# Patient Record
Sex: Male | Born: 1990 | Race: Black or African American | Hispanic: No | Marital: Single | Smoking: Current every day smoker
Health system: Southern US, Community
[De-identification: ages and names within clinical notes are randomized; demographics above are authoritative.]

## PROBLEM LIST (undated history)

## (undated) DIAGNOSIS — F319 Bipolar disorder, unspecified: Secondary | ICD-10-CM

## (undated) DIAGNOSIS — F209 Schizophrenia, unspecified: Secondary | ICD-10-CM

---
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
## 2015-08-13 ENCOUNTER — Emergency Department
Admission: EM | Admit: 2015-08-13 | Discharge: 2015-08-14 | Disposition: A | Discharge status: Home / Self Care | Payer: Self-pay | Attending: Emergency Medicine | Admitting: Emergency Medicine

## 2015-08-13 DIAGNOSIS — F1210 Cannabis abuse, uncomplicated: Secondary | ICD-10-CM | POA: Insufficient documentation

## 2015-08-13 DIAGNOSIS — Z720 Tobacco use: Secondary | ICD-10-CM | POA: Insufficient documentation

## 2015-08-13 DIAGNOSIS — F1010 Alcohol abuse, uncomplicated: Secondary | ICD-10-CM | POA: Insufficient documentation

## 2015-08-13 LAB — ETHANOL: ALCOHOL ETHYL (B): 190 mg/dL — AB (ref <5)

## 2015-08-13 LAB — COMPREHENSIVE METABOLIC PANEL
ALT: 22 U/L (ref 17–63)
ANION GAP: 11 (ref 5–15)
AST: 41 U/L (ref 15–41)
Albumin: 5.1 g/dL — ABNORMAL HIGH (ref 3.5–5.0)
Alkaline Phosphatase: 53 U/L (ref 38–126)
BUN: 7 mg/dL (ref 6–20)
CHLORIDE: 108 mmol/L (ref 101–111)
CO2: 24 mmol/L (ref 22–32)
Calcium: 9.5 mg/dL (ref 8.9–10.3)
Creatinine, Ser: 1.15 mg/dL (ref 0.61–1.24)
Glucose, Bld: 87 mg/dL (ref 65–99)
POTASSIUM: 4.3 mmol/L (ref 3.5–5.1)
Sodium: 143 mmol/L (ref 135–145)
Total Bilirubin: 1 mg/dL (ref 0.3–1.2)
Total Protein: 7.8 g/dL (ref 6.5–8.1)

## 2015-08-13 LAB — SALICYLATE LEVEL: Salicylate Lvl: <4 mg/dL (ref 2.8–30.0)

## 2015-08-13 LAB — CBC
HEMATOCRIT: 45.7 % (ref 40.0–52.0)
HEMOGLOBIN: 14.8 g/dL (ref 13.0–18.0)
MCH: 28.2 pg (ref 26.0–34.0)
MCHC: 32.5 g/dL (ref 32.0–36.0)
MCV: 86.8 fL (ref 80.0–100.0)
Platelets: 233 10*3/uL (ref 150–440)
RBC: 5.26 MIL/uL (ref 4.40–5.90)
RDW: 13.4 % (ref 11.5–14.5)
WBC: 8.1 10*3/uL (ref 3.8–10.6)

## 2015-08-13 LAB — URINE DRUG SCREEN, QUALITATIVE (ARMC ONLY)
AMPHETAMINES, UR SCREEN: NOT DETECTED
BENZODIAZEPINE, UR SCRN: NOT DETECTED
Barbiturates, Ur Screen: NOT DETECTED
COCAINE METABOLITE, UR ~~LOC~~: NOT DETECTED
Cannabinoid 50 Ng, Ur ~~LOC~~: POSITIVE — AB
MDMA (Ecstasy)Ur Screen: NOT DETECTED
METHADONE SCREEN, URINE: NOT DETECTED
OPIATE, UR SCREEN: NOT DETECTED
Phencyclidine (PCP) Ur S: NOT DETECTED
Tricyclic, Ur Screen: NOT DETECTED

## 2015-08-13 LAB — ACETAMINOPHEN LEVEL
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
## 2015-08-13 NOTE — ED Notes (Signed)
Tele-intake set-up for pt ; [place], intake spec, to pt ATT

## 2015-08-13 NOTE — ED Notes (Signed)
Pt sitting on floor; despondent and tearful; moved back to bed; security and tech made aware

## 2015-08-14 DIAGNOSIS — F1020 Alcohol dependence, uncomplicated: Secondary | ICD-10-CM

## 2015-08-14 NOTE — ED Notes (Signed)

## 2015-08-14 NOTE — ED Notes (Signed)
BEHAVIORAL HEALTH ROUNDING Patient sleeping: No. Patient alert and oriented: yes Behavior appropriate: Yes.  ; If no, describe:   Nutrition and fluids offered: Yes  Toileting and hygiene offered: Yes  Sitter present: no Law enforcement present: Yes  and ODS  

## 2015-08-14 NOTE — ED Notes (Signed)
BEHAVIORAL HEALTH ROUNDING Patient sleeping: Yes.   Patient alert and oriented: not applicable Behavior appropriate: Yes.  ; If no, describe:   Nutrition and fluids offered: No Toileting and hygiene offered: No Sitter present: no Law enforcement present: Yes  and ODS    

## 2015-08-14 NOTE — ED Notes (Addendum)
BEHAVIORAL HEALTH ROUNDING Patient sleeping: Yes.   Patient alert and oriented: not applicable Behavior appropriate: Yes.  ; If no, describe:   Nutrition and fluids offered: No Toileting and hygiene offered: No Sitter present: no Law enforcement present: Yes  and ODS    

## 2015-08-14 NOTE — ED Notes (Signed)
BEHAVIORAL HEALTH ROUNDING Patient sleeping: No. Patient alert and oriented: yes Behavior appropriate: Yes.  ; If no, describe: animated and cooperative Nutrition and fluids offered: Yes  Toileting and hygiene offered: Yes  Sitter present: no Law enforcement present: Yes  and ODS  ENVIRONMENTAL ASSESSMENT Potentially harmful objects out of patient reach: Yes.   Personal belongings secured: Yes.   Patient dressed in hospital provided attire only: Yes.   Plastic bags out of patient reach: Yes.   Patient care equipment (cords, cables, call bells, lines, and drains) shortened, removed, or accounted for: Yes.   Equipment and supplies removed from bottom of stretcher: Yes.   Potentially toxic materials out of patient reach: Yes.   Sharps container removed or out of patient reach: Yes.

## 2015-08-14 NOTE — ED Provider Notes (Signed)
-----------------------------------------   6:31 AM on 08/14/2015 -----------------------------------------   Blood pressure 107/80, pulse 71, temperature 97.9 F (36.6 C), temperature source Oral, resp. rate 18, height  (1.88 m), weight 131 lb (59.421 kg), SpO2 96 %.  The patient had no acute events since last update.  Calm and cooperative at this time.  Disposition is pending per Psychiatry/Behavioral Medicine team recommendations.     Arnaldo Natal, MD 08/14/15 [phone]

## 2015-08-14 NOTE — ED Notes (Signed)

## 2015-08-14 NOTE — ED Provider Notes (Signed)
-----------------------------------------   11:04 AM on 08/14/2015 -----------------------------------------   Blood pressure 118/76, pulse 69, temperature 98 F (36.7 C), temperature source Oral, resp. rate 18, height  (1.88 m), weight 131 lb (59.421 kg), SpO2 100 %.  The patient had no acute events since last update.  Calm and cooperative at this time.    Patient has remained medically stable in the ED. Case is discussed with social work and Dr. Micki Riley psychiatry note was reviewed. He is found to be psychiatrically stable at this time as well. He will go home to his grandmother's house and has been given abundant outpatient referral resources for further evaluation and management of his alcohol abuse and chronic mental health problems.  Final diagnoses:  Alcohol abuse     Sharman Cheek, MD 08/14/15 1105
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
## 2015-08-14 NOTE — ED Notes (Signed)

## 2015-08-14 NOTE — ED Notes (Signed)
Patient discharged ambulatory to self. He denies SI or HI. Discharge instructions reviewed with patient, he verbalizes understanding. Patient received all personal belongings. 

## 2015-08-14 NOTE — ED Notes (Signed)
Seen by social worker
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
## 2015-08-14 NOTE — Discharge Instructions (Signed)
Alcohol Use Disorder Alcohol use disorder is a mental disorder. It is not a one-time incident of heavy drinking. Alcohol use disorder is the excessive and uncontrollable use of alcohol over time that leads to problems with functioning in one or more areas of daily living. People with this disorder risk harming themselves and others when they drink to excess. Alcohol use disorder also can cause other mental disorders, such as mood and anxiety disorders, and serious physical problems. People with alcohol use disorder often misuse other drugs.  Alcohol use disorder is common and widespread. Some people with this disorder drink alcohol to cope with or escape from negative life events. Others drink to relieve chronic pain or symptoms of mental illness. People with a family history of alcohol use disorder are at higher risk of losing control and using alcohol to excess.  SYMPTOMS  Signs and symptoms of alcohol use disorder may include the following:   Consumption ofalcohol inlarger amounts or over a longer period of time than intended.  Multiple unsuccessful attempts to cutdown or control alcohol use.   A great deal of time spent obtaining alcohol, using alcohol, or recovering from the effects of alcohol (hangover).  A strong desire or urge to use alcohol (cravings).   Continued use of alcohol despite problems at work, school, or home because of alcohol use.   Continued use of alcohol despite problems in relationships because of alcohol use.  Continued use of alcohol in situations when it is physically hazardous, such as driving a car.  Continued use of alcohol despite awareness of a physical or psychological problem that is likely related to alcohol use. Physical problems related to alcohol use can involve the brain, heart, liver, stomach, and intestines. Psychological problems related to alcohol use include intoxication, depression, anxiety, psychosis, delirium, and dementia.   The need for  increased amounts of alcohol to achieve the same desired effect, or a decreased effect from the consumption of the same amount of alcohol (tolerance).  Withdrawal symptoms upon reducing or stopping alcohol use, or alcohol use to reduce or avoid withdrawal symptoms. Withdrawal symptoms include:  Racing heart.  Hand tremor.  Difficulty sleeping.  Nausea.  Vomiting.  Hallucinations.  Restlessness.  Seizures. DIAGNOSIS Alcohol use disorder is diagnosed through an assessment by your health care provider. Your health care provider may start by asking three or four questions to screen for excessive or problematic alcohol use. To confirm a diagnosis of alcohol use disorder, at least two symptoms must be present within a 12-month period. The severity of alcohol use disorder depends on the number of symptoms:  Mild--two or three.  Moderate--four or five.  Severe--six or more. Your health care provider may perform a physical exam or use results from lab tests to see if you have physical problems resulting from alcohol use. Your health care provider may refer you to a mental health professional for evaluation. TREATMENT  Some people with alcohol use disorder are able to reduce their alcohol use to low-risk levels. Some people with alcohol use disorder need to quit drinking alcohol. When necessary, mental health professionals with specialized training in substance use treatment can help. Your health care provider can help you decide how severe your alcohol use disorder is and what type of treatment you need. The following forms of treatment are available:   Detoxification. Detoxification involves the use of prescription medicines to prevent alcohol withdrawal symptoms in the first week after quitting. This is important for people with a history of symptoms   of withdrawal and for heavy drinkers who are likely to have withdrawal symptoms. Alcohol withdrawal can be dangerous and, in severe cases, cause  death. Detoxification is usually provided in a hospital or in-patient substance use treatment facility.  Counseling or talk therapy. Talk therapy is provided by substance use treatment counselors. It addresses the reasons people use alcohol and ways to keep them from drinking again. The goals of talk therapy are to help people with alcohol use disorder find healthy activities and ways to cope with life stress, to identify and avoid triggers for alcohol use, and to handle cravings, which can cause relapse.  Medicines.Different medicines can help treat alcohol use disorder through the following actions:  Decrease alcohol cravings.  Decrease the positive reward response felt from alcohol use.  Produce an uncomfortable physical reaction when alcohol is used (aversion therapy).  Support groups. Support groups are run by people who have quit drinking. They provide emotional support, advice, and guidance. These forms of treatment are often combined. Some people with alcohol use disorder benefit from intensive combination treatment provided by specialized substance use treatment centers. Both inpatient and outpatient treatment programs are available. Document Released: 01/09/2005 Document Revised: 04/18/2014 Document Reviewed: 03/11/2013 Exit[employer] Patient Information 2015 [employer]. This information is not intended to replace advice given to you by your health care provider. Make sure you discuss any questions you have with your health care provider.  

## 2015-11-11 ENCOUNTER — Emergency Department
Admission: EM | Admit: 2015-11-11 | Discharge: 2015-11-12 | Disposition: A | Discharge status: Other Acute Inpatient Hospital | Payer: Self-pay | Attending: Emergency Medicine | Admitting: Emergency Medicine

## 2015-11-11 ENCOUNTER — Encounter: Payer: Self-pay | Admitting: Emergency Medicine

## 2015-11-11 DIAGNOSIS — F10229 Alcohol dependence with intoxication, unspecified: Secondary | ICD-10-CM | POA: Insufficient documentation

## 2015-11-11 DIAGNOSIS — F10929 Alcohol use, unspecified with intoxication, unspecified: Secondary | ICD-10-CM

## 2015-11-11 DIAGNOSIS — F29 Unspecified psychosis not due to a substance or known physiological condition: Secondary | ICD-10-CM | POA: Insufficient documentation

## 2015-11-11 DIAGNOSIS — F1210 Cannabis abuse, uncomplicated: Secondary | ICD-10-CM | POA: Insufficient documentation

## 2015-11-11 DIAGNOSIS — F17210 Nicotine dependence, cigarettes, uncomplicated: Secondary | ICD-10-CM | POA: Insufficient documentation

## 2015-11-11 LAB — CBC
HCT: 50.1 % (ref 40.0–52.0)
Hemoglobin: 16.2 g/dL (ref 13.0–18.0)
MCH: 28.3 pg (ref 26.0–34.0)
MCHC: 32.4 g/dL (ref 32.0–36.0)
MCV: 87.4 fL (ref 80.0–100.0)
PLATELETS: 315 10*3/uL (ref 150–440)
RBC: 5.73 MIL/uL (ref 4.40–5.90)
RDW: 14 % (ref 11.5–14.5)
WBC: 9.8 10*3/uL (ref 3.8–10.6)

## 2015-11-11 LAB — COMPREHENSIVE METABOLIC PANEL
ALBUMIN: 4.9 g/dL (ref 3.5–5.0)
ALT: 26 U/L (ref 17–63)
ANION GAP: 12 (ref 5–15)
AST: 48 U/L — AB (ref 15–41)
Alkaline Phosphatase: 95 U/L (ref 38–126)
BILIRUBIN TOTAL: 0.4 mg/dL (ref 0.3–1.2)
BUN: 14 mg/dL (ref 6–20)
CHLORIDE: 109 mmol/L (ref 101–111)
CO2: 23 mmol/L (ref 22–32)
Calcium: 9.1 mg/dL (ref 8.9–10.3)
Creatinine, Ser: 1.02 mg/dL (ref 0.61–1.24)
GFR calc Af Amer: >60 mL/min (ref >60)
Glucose, Bld: 86 mg/dL (ref 65–99)
POTASSIUM: 4 mmol/L (ref 3.5–5.1)
Sodium: 144 mmol/L (ref 135–145)
TOTAL PROTEIN: 8.1 g/dL (ref 6.5–8.1)

## 2015-11-11 LAB — ETHANOL: ALCOHOL ETHYL (B): 374 mg/dL — AB (ref <5)

## 2015-11-11 LAB — SALICYLATE LEVEL

## 2015-11-11 LAB — ACETAMINOPHEN LEVEL

## 2015-11-11 MED ORDER — ZIPRASIDONE HCL 20 MG PO CAPS
ORAL_CAPSULE | ORAL | Status: AC
  Administered 2015-11-11: 20 mg
  Filled 2015-11-11: qty 1
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
## 2015-11-11 NOTE — ED Provider Notes (Signed)
[facility] Emergency Department Provider Note  Time seen: 8:55 PM  I have reviewed the triage vital signs and the nursing notes.   HISTORY  Chief Complaint Suicidal    HPI Joshua [name] is a 24 y.o. male with a past medical history of suicide attempts in the past, depression, who presents the emergency department under an involuntary commitment. According to the involuntary commitment taken out by police the patient was in the front yard yelling that he knows the devil, and he will show everybody the devil. IVC also states the patient was making suicidal comments such as known cares if he is around anymore. In the emergency department the patient continues to show bizarre behavior, laughing inappropriately at times, walking around the room, police are present but not currently requiring restraints at this time.      History reviewed. No pertinent past medical history.  Patient Active Problem List   Diagnosis Date Noted  . Alcohol use disorder, moderate, dependence (HCC)     History reviewed. No pertinent past surgical history.  No current outpatient prescriptions on file.  Allergies Review of patient's allergies indicates no known allergies.  No family history on file.  Social History Social History  Substance Use Topics  . Smoking status: Current Every Day Smoker -- 1.00 packs/day for 5 years    Types: Cigarettes  . Smokeless tobacco: None  . Alcohol Use: Yes     Comment: pt states does not drink every day, but drank 2 40's today.    Review of Systems Constitutional: Negative for fever. Cardiovascular: Negative for chest pain. Respiratory: Negative for shortness of breath. Gastrointestinal: Negative for abdominal pain Musculoskeletal: Negative for back pain. Neurological: Negative for headache Psychiatric: Bizarre behavior but denies any SI or HI in the emergency department. 10-point ROS otherwise  negative.  ____________________________________________   PHYSICAL EXAM:  VITAL SIGNS: ED Triage Vitals  Enc Vitals Group     BP 11/11/15 1943 110/80 mmHg     Pulse Rate 11/11/15 1943 122     Resp 11/11/15 1943 16     Temp 11/11/15 1943 97.8 F (36.6 C)     Temp Source 11/11/15 1943 Oral     SpO2 11/11/15 1943 95 %     Weight --      Height --      Head Cir --      Peak Flow --      Pain Score --      Pain Loc --      Pain Edu? --      Excl. in GC? --     Constitutional: Alert and oriented. Some agitation, pacing back and forth in the room saying he just wants to go home. Laughing inappropriately at times Eyes: Normal exam ENT   Head: Normocephalic and atraumatic.   Mouth/Throat: Mucous membranes are moist. Cardiovascular: Normal rate, regular rhythm. No murmur Respiratory: Normal respiratory effort without tachypnea nor retractions. Breath sounds are clear Gastrointestinal: Soft and nontender. No distention.  Musculoskeletal: Nontender with normal range of motion in all extremities.  Neurologic:  Normal speech and language. No gross focal neurologic deficits  Skin:  Skin is warm, dry and intact.  Psychiatric: Bizarre behaviors, pacing back and forth, laughing inappropriately, denies SI or HI.  ____________________________________________    INITIAL IMPRESSION / ASSESSMENT AND PLAN / ED COURSE  Pertinent labs & imaging results that were available during my care of the patient were reviewed by me and considered in my medical  decision making (see chart for details).  Patient presents under involuntary commitment for suicidal ideation as well as bizarre behaviors making comments such as he knows the devil. I reviewed the patient's medical and psychiatric history, the patient has been to the emergency department in the past for suicidal ideation/gesture, however I do not see any history of schizophrenia or psychosis in the past. Patient appears to be suffering from  psychosis currently. We will check labs including urine drug screen, continue the involuntary commitment until the patient can be appropriately evaluated by psychiatry.  Alcoholism resulted 374. Labs are largely within normal limits otherwise. Urine and urine drug screen pending. ____________________________________________   FINAL CLINICAL IMPRESSION(S) / ED DIAGNOSES  psychosis Suicidal ideation   Minna Antis, MD 11/11/15 2310

## 2015-11-11 NOTE — ED Notes (Signed)
Lab called with etoh of .374  Er md aware.

## 2015-11-11 NOTE — ED Notes (Signed)
Pt lying down on bed. 

## 2015-11-11 NOTE — ED Notes (Signed)
Pt sleeping. 

## 2015-11-12 LAB — URINE DRUG SCREEN, QUALITATIVE (ARMC ONLY)
AMPHETAMINES, UR SCREEN: NOT DETECTED
BENZODIAZEPINE, UR SCRN: NOT DETECTED
Barbiturates, Ur Screen: NOT DETECTED
Cannabinoid 50 Ng, Ur ~~LOC~~: POSITIVE — AB
Cocaine Metabolite,Ur ~~LOC~~: NOT DETECTED
MDMA (Ecstasy)Ur Screen: NOT DETECTED
METHADONE SCREEN, URINE: NOT DETECTED
OPIATE, UR SCREEN: NOT DETECTED
Phencyclidine (PCP) Ur S: NOT DETECTED
TRICYCLIC, UR SCREEN: NOT DETECTED

## 2015-11-12 NOTE — ED Notes (Addendum)

## 2015-11-12 NOTE — ED Notes (Signed)
Patient resting quietly in room. No noted distress or abnormal behaviors noted. Will continue 15 minute checks and observation by security camera for safety. 

## 2015-11-12 NOTE — ED Notes (Signed)
Patient awake, alert, and oriented. Appetite good at lunch. In NAD.  Will continue all safety checks.

## 2015-11-12 NOTE — ED Notes (Signed)
Patient asleep in room. No noted distress or abnormal behavior. Will continue 15 minute checks and observation by security cameras for safety. 

## 2015-11-12 NOTE — Discharge Instructions (Signed)
Alcohol Intoxication Alcohol intoxication occurs when you drink enough alcohol that it affects your ability to function. It can be mild or very severe. Drinking a lot of alcohol in a short time is called binge drinking. This can be very harmful. Drinking alcohol can also be more dangerous if you are taking medicines or other drugs. Some of the effects caused by alcohol may include:  Loss of coordination.  Changes in mood and behavior.  Unclear thinking.  Trouble talking (slurred speech).  Throwing up (vomiting).  Confusion.  Slowed breathing.  Twitching and shaking (seizures).  Loss of consciousness. HOME CARE  Do not drive after drinking alcohol.  Drink enough water and fluids to keep your pee (urine) clear or pale yellow. Avoid caffeine.  Only take medicine as told by your doctor. GET HELP IF:  You throw up (vomit) many times.  You do not feel better after a few days.  You frequently have alcohol intoxication. Your doctor can help decide if you should see a substance use treatment counselor. GET HELP RIGHT AWAY IF:  You become shaky when you stop drinking.  You have twitching and shaking.  You throw up blood. It may look bright red or like coffee grounds.  You notice blood in your poop (bowel movements).  You become lightheaded or pass out (faint). MAKE SURE YOU:   Understand these instructions.  Will watch your condition.  Will get help right away if you are not doing well or get worse.   This information is not intended to replace advice given to you by your health care provider. Make sure you discuss any questions you have with your health care provider.   Document Released: 05/20/2008 Document Revised: 08/04/2013 Document Reviewed: 05/07/2013 Elsevier Interactive Patient Education 2016 [employer]  Please follow-up with [facility]. Please return for any further problems

## 2015-11-12 NOTE — ED Provider Notes (Signed)
-----------------------------------------   6:56 AM on 11/12/2015 -----------------------------------------   Blood pressure 114/81, pulse 100, temperature 97.8 F (36.6 C), temperature source Oral, resp. rate 16, SpO2 95 %.  The patient had no acute events since last update.  Calm and cooperative at this time.  Disposition is pending per Psychiatry/Behavioral Medicine team recommendations.     Irean HongJade J Sung, MD 11/12/15 [phone]

## 2015-11-12 NOTE — ED Provider Notes (Signed)
Dr. Elba Barmanhala has seen the patient discontinue the IV C patient to be discharged follow-up with [facility]  Arnaldo NatalPaul F Malinda, MD 11/12/15 [phone]

## 2015-11-12 NOTE — ED Notes (Signed)
Pt sleeping in dark room. Denied wanted breakfast tray at this time. Will continue to monitor. Scientist, research (life sciences)itter and officer present.

## 2015-11-12 NOTE — Consult Note (Signed)
[facility] Psychiatry Consult   Reason for Consult:  Follow up Referring Physician:  ER Patient Identification: Joshua [name] MRN:  [id number] Principal Diagnosis: <principal problem not specified> Diagnosis:   Patient Active Problem List   Diagnosis Date Noted  . Alcohol use disorder, moderate, dependence (HCC) [F10.20]     Total Time spent with patient: 45 minutes  Subjective:   Joshua [name] is a 24 y.o. male patient admitted with a long H/O Alcohol abuse and THC abuse and is employed at [employer] and lives with family.  HPI:  Pt's BAL was 374 last night and in addition smokes THC.  Past Psychiatric History: Long H/O Alcohol dependence.  Risk to Self: Suicidal Ideation: No-Not Currently/Within Last 6 Months Suicidal Intent: No-Not Currently/Within Last 6 Months Is patient at risk for suicide?: No Suicidal Plan?: No-Not Currently/Within Last 6 Months Access to Means: No What has been your use of drugs/alcohol within the last 12 months?: alcohol How many times?: 2 Other Self Harm Risks: na Triggers for Past Attempts: Hallucinations, Other (Comment) (SA) Intentional Self Injurious Behavior: None Risk to Others: Homicidal Ideation: No-Not Currently/Within Last 6 Months Thoughts of Harm to Others: No-Not Currently Present/Within Last 6 Months Current Homicidal Intent: No-Not Currently/Within Last 6 Months Current Homicidal Plan: No-Not Currently/Within Last 6 Months Access to Homicidal Means: No Identified Victim: na History of harm to others?: No Assessment of Violence: None Noted Violent Behavior Description: na Does patient have access to weapons?: No Criminal Charges Pending?: No Does patient have a court date: No Prior Inpatient Therapy: Prior Inpatient Therapy: Yes Prior Therapy Dates: 2012 Prior Therapy Facilty/Provider(s): [facility] Reason for Treatment: SI Prior Outpatient Therapy: Prior Outpatient Therapy: No Does patient have an ACCT team?: No Does  patient have Intensive In-House Services?  : No Does patient have [facility] services? : No Does patient have P4CC services?: No  Past Medical History: History reviewed. No pertinent past medical history. History reviewed. No pertinent past surgical history. Family History: No family history on file. Family Psychiatric  History: none Social History:  History  Alcohol Use  . Yes    Comment: pt states does not drink every day, but drank 2 40's today.     History  Drug Use No    Social History   Social History  . Marital Status: Single    Spouse Name: N/A  . Number of Children: N/A  . Years of Education: N/A   Social History Main Topics  . Smoking status: Current Every Day Smoker -- 1.00 packs/day for 5 years    Types: Cigarettes  . Smokeless tobacco: None  . Alcohol Use: Yes     Comment: pt states does not drink every day, but drank 2 40's today.  . Drug Use: No  . Sexual Activity: Not Asked   Other Topics Concern  . None   Social History Narrative   Additional Social History:    Pain Medications: see charts Prescriptions: see charts Over the Counter: see charts History of alcohol / drug use?: Yes Longest period of sobriety (when/how long): none Negative Consequences of Use: Financial, Legal, Personal relationships, Work / School Withdrawal Symptoms:  (none) Name of Substance 1: alcohol 1 - Age of First Use: 18 1 - Amount (size/oz): 2-24 ounces beers  1 - Frequency: daily 1 - Last Use / Amount: 11/26 Name of Substance 2: Cannbis 2 - Age of First Use: 16 2 - Amount (size/oz): blunt 2 - Frequency: couple times a week  Allergies:  No Known Allergies  Labs:  Results for orders placed or performed during the hospital encounter of 11/11/15 (from the past 48 hour(s))  Comprehensive metabolic panel     Status: Abnormal   Collection Time: 11/11/15  8:10 PM  Result Value Ref Range   Sodium 144 135 - 145 mmol/L   Potassium 4.0 3.5 - 5.1 mmol/L    Chloride 109 101 - 111 mmol/L   CO2 23 22 - 32 mmol/L   Glucose, Bld 86 65 - 99 mg/dL   BUN 14 6 - 20 mg/dL   Creatinine, Ser 1.02 0.61 - 1.24 mg/dL   Calcium 9.1 8.9 - 10.3 mg/dL   Total Protein 8.1 6.5 - 8.1 g/dL   Albumin 4.9 3.5 - 5.0 g/dL   AST 48 (H) 15 - 41 U/L   ALT 26 17 - 63 U/L   Alkaline Phosphatase 95 38 - 126 U/L   Total Bilirubin 0.4 0.3 - 1.2 mg/dL   GFR calc non Af Amer >60 >60 mL/min   GFR calc Af Amer >60 >60 mL/min    Comment: (NOTE) The eGFR has been calculated using the CKD EPI equation. This calculation has not been validated in all clinical situations. eGFR's persistently <60 mL/min signify possible Chronic Kidney Disease.    Anion gap 12 5 - 15  Ethanol (ETOH)     Status: Abnormal   Collection Time: 11/11/15  8:10 PM  Result Value Ref Range   Alcohol, Ethyl (B) 374 (HH) <5 mg/dL    Comment: CRITICAL RESULT CALLED TO, READ BACK BY AND VERIFIED WITH AMY COYNE AT 2108 11/11/15.PMH        LOWEST DETECTABLE LIMIT FOR SERUM ALCOHOL IS 5 mg/dL FOR MEDICAL PURPOSES ONLY   Salicylate level     Status: None   Collection Time: 11/11/15  8:10 PM  Result Value Ref Range   Salicylate Lvl <7.4 2.8 - 30.0 mg/dL  Acetaminophen level     Status: Abnormal   Collection Time: 11/11/15  8:10 PM  Result Value Ref Range   Acetaminophen (Tylenol), Serum <10 (L) 10 - 30 ug/mL    Comment:        THERAPEUTIC CONCENTRATIONS VARY SIGNIFICANTLY. A RANGE OF 10-30 ug/mL MAY BE AN EFFECTIVE CONCENTRATION FOR MANY PATIENTS. HOWEVER, SOME ARE BEST TREATED AT CONCENTRATIONS OUTSIDE THIS RANGE. ACETAMINOPHEN CONCENTRATIONS >150 ug/mL AT 4 HOURS AFTER INGESTION AND >50 ug/mL AT 12 HOURS AFTER INGESTION ARE OFTEN ASSOCIATED WITH TOXIC REACTIONS.   CBC     Status: None   Collection Time: 11/11/15  8:10 PM  Result Value Ref Range   WBC 9.8 3.8 - 10.6 K/uL   RBC 5.73 4.40 - 5.90 MIL/uL   Hemoglobin 16.2 13.0 - 18.0 g/dL   HCT 50.1 40.0 - 52.0 %   MCV 87.4 80.0 - 100.0 fL    MCH 28.3 26.0 - 34.0 pg   MCHC 32.4 32.0 - 36.0 g/dL   RDW 14.0 11.5 - 14.5 %   Platelets 315 150 - 440 K/uL  Urine Drug Screen, Qualitative ([place] only)     Status: Abnormal   Collection Time: 11/12/15  8:35 AM  Result Value Ref Range   Tricyclic, Ur Screen NONE DETECTED NONE DETECTED   Amphetamines, Ur Screen NONE DETECTED NONE DETECTED   MDMA (Ecstasy)Ur Screen NONE DETECTED NONE DETECTED   Cocaine Metabolite,Ur [place] NONE DETECTED NONE DETECTED   Opiate, Ur Screen NONE DETECTED NONE DETECTED   Phencyclidine (PCP) Ur S NONE DETECTED NONE  DETECTED   Cannabinoid 50 Ng, Ur [place] POSITIVE (A) NONE DETECTED   Barbiturates, Ur Screen NONE DETECTED NONE DETECTED   Benzodiazepine, Ur Scrn NONE DETECTED NONE DETECTED   Methadone Scn, Ur NONE DETECTED NONE DETECTED    Comment: (NOTE) 920  Tricyclics, urine               Cutoff 1000 ng/mL 200  Amphetamines, urine             Cutoff 1000 ng/mL 300  MDMA (Ecstasy), urine           Cutoff 500 ng/mL 400  Cocaine Metabolite, urine       Cutoff 300 ng/mL 500  Opiate, urine                   Cutoff 300 ng/mL 600  Phencyclidine (PCP), urine      Cutoff 25 ng/mL 700  Cannabinoid, urine              Cutoff 50 ng/mL 800  Barbiturates, urine             Cutoff 200 ng/mL 900  Benzodiazepine, urine           Cutoff 200 ng/mL 1000 Methadone, urine                Cutoff 300 ng/mL 1100 1200 The urine drug screen provides only a preliminary, unconfirmed 1300 analytical test result and should not be used for non-medical 1400 purposes. Clinical consideration and professional judgment should 1500 be applied to any positive drug screen result due to possible 1600 interfering substances. A more specific alternate chemical method 1700 must be used in order to obtain a confirmed analytical result.  1800 Gas chromato graphy / mass spectrometry (GC/MS) is the preferred 1900 confirmatory method.     No current facility-administered medications for this encounter.    No current outpatient prescriptions on file.    Musculoskeletal: Strength & Muscle Tone: within normal limits Gait & Station: normal Patient leans: N/A  Psychiatric Specialty Exam: Review of Systems  All other systems reviewed and are negative.   Blood pressure 103/67, pulse 91, temperature 98.7 F (37.1 C), temperature source Oral, resp. rate 18, SpO2 97 %.There is no weight on file to calculate BMI.  General Appearance: Casual  Eye Contact::  Fair  Speech:  Clear and Coherent  Volume:  Normal  Mood:  NA  Affect:  Appropriate  Thought Process:  Intact  Orientation:  Full (Time, Place, and Person)  Thought Content:  Negative  Suicidal Thoughts:  No  Homicidal Thoughts:  No  Memory:  Immediate;   Fair Recent;   Fair Remote;   Fair adequate.  Judgement:  Fair  Insight:  Fair  Psychomotor Activity:  Normal  Concentration:  Fair  Recall:  [employer] of Knowledge:Fair  Language: Fair  Akathisia:  No  Handed:  Right  AIMS (if indicated):     Assets:  Communication Skills Desire for Improvement Housing Social Support Others:  family and has apt at [facility] with their Substance abuse program.  ADL's:  Intact  Cognition: WNL  Sleep:      Treatment Plan Summary: Plan D/C IVC and discharge pt and has a job at [employer] and apt with [place] Substance abuse program.  Disposition: No evidence of imminent risk to self or others at present.    Dewain Penning 11/12/2015 4:30 PM

## 2015-11-12 NOTE — ED Notes (Signed)
Pt given urine specimen cup and instructed to produce specimen when able. Ambulated to bathroom. Meal tray provided per request and laid on bed for pt when returns from restroom. Denies any further needs.
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
## 2015-11-12 NOTE — ED Notes (Signed)
Patient aware he will be discharged today. Awaiting all documentation from ED physician.

## 2015-11-12 NOTE — ED Notes (Signed)
Patient met with psychiatrist. Awaiting disposition. Maintained on all safety precautions. 

## 2015-11-12 NOTE — ED Notes (Signed)
Patient discharged ambulatory to home, accompanied by family. He denies SI or HI. Discharge instructions reviewed with staff, he verbalizes understanding. Patient received copy of DC plan and all personal belongings.

## 2015-11-12 NOTE — ED Notes (Signed)
Report given to Amy RN.

## 2015-11-12 NOTE — ED Notes (Signed)
Patient visited with family members. Maintained on 15 minute checks and observation by security camera for safety.

## 2016-03-26 ENCOUNTER — Encounter: Payer: Self-pay | Admitting: Emergency Medicine

## 2016-03-26 ENCOUNTER — Inpatient Hospital Stay
Admission: EM | Admit: 2016-03-26 | Discharge: 2016-04-01 | DRG: 885 | Disposition: A | Discharge status: Home / Self Care | Payer: Self-pay | Source: Intra-hospital | Attending: Psychiatry | Admitting: Psychiatry

## 2016-03-26 ENCOUNTER — Emergency Department
Admission: EM | Admit: 2016-03-26 | Discharge: 2016-03-26 | Disposition: A | Discharge status: Other Acute Inpatient Hospital | Payer: Self-pay | Attending: Emergency Medicine | Admitting: Emergency Medicine

## 2016-03-26 DIAGNOSIS — F17210 Nicotine dependence, cigarettes, uncomplicated: Secondary | ICD-10-CM | POA: Insufficient documentation

## 2016-03-26 DIAGNOSIS — F319 Bipolar disorder, unspecified: Secondary | ICD-10-CM | POA: Insufficient documentation

## 2016-03-26 DIAGNOSIS — F200 Paranoid schizophrenia: Secondary | ICD-10-CM

## 2016-03-26 DIAGNOSIS — F209 Schizophrenia, unspecified: Secondary | ICD-10-CM | POA: Insufficient documentation

## 2016-03-26 DIAGNOSIS — F1020 Alcohol dependence, uncomplicated: Secondary | ICD-10-CM | POA: Insufficient documentation

## 2016-03-26 DIAGNOSIS — R443 Hallucinations, unspecified: Secondary | ICD-10-CM

## 2016-03-26 DIAGNOSIS — F1210 Cannabis abuse, uncomplicated: Secondary | ICD-10-CM | POA: Insufficient documentation

## 2016-03-26 HISTORY — DX: Bipolar disorder, unspecified: F31.9

## 2016-03-26 HISTORY — DX: Schizophrenia, unspecified: F20.9

## 2016-03-26 LAB — URINE DRUG SCREEN, QUALITATIVE (ARMC ONLY)
Amphetamines, Ur Screen: NOT DETECTED
BARBITURATES, UR SCREEN: NOT DETECTED
Benzodiazepine, Ur Scrn: NOT DETECTED
CANNABINOID 50 NG, UR ~~LOC~~: POSITIVE — AB
Cocaine Metabolite,Ur ~~LOC~~: NOT DETECTED
MDMA (Ecstasy)Ur Screen: NOT DETECTED
Methadone Scn, Ur: NOT DETECTED
Opiate, Ur Screen: NOT DETECTED
Phencyclidine (PCP) Ur S: NOT DETECTED
TRICYCLIC, UR SCREEN: NOT DETECTED

## 2016-03-26 LAB — CBC
HCT: 42.7 % (ref 40.0–52.0)
Hemoglobin: 14.1 g/dL (ref 13.0–18.0)
MCH: 28.6 pg (ref 26.0–34.0)
MCHC: 33 g/dL (ref 32.0–36.0)
MCV: 86.7 fL (ref 80.0–100.0)
PLATELETS: 250 10*3/uL (ref 150–440)
RBC: 4.92 MIL/uL (ref 4.40–5.90)
RDW: 13.4 % (ref 11.5–14.5)
WBC: 13.7 10*3/uL — ABNORMAL HIGH (ref 3.8–10.6)

## 2016-03-26 LAB — COMPREHENSIVE METABOLIC PANEL
ALBUMIN: 5 g/dL (ref 3.5–5.0)
ALT: 17 U/L (ref 17–63)
AST: 25 U/L (ref 15–41)
Alkaline Phosphatase: 64 U/L (ref 38–126)
Anion gap: 10 (ref 5–15)
BUN: 13 mg/dL (ref 6–20)
CHLORIDE: 106 mmol/L (ref 101–111)
CO2: 20 mmol/L — ABNORMAL LOW (ref 22–32)
CREATININE: 0.97 mg/dL (ref 0.61–1.24)
Calcium: 9 mg/dL (ref 8.9–10.3)
GFR calc Af Amer: >60 mL/min (ref >60)
GFR calc non Af Amer: >60 mL/min (ref >60)
GLUCOSE: 108 mg/dL — AB (ref 65–99)
POTASSIUM: 3.7 mmol/L (ref 3.5–5.1)
Sodium: 136 mmol/L (ref 135–145)
Total Bilirubin: 0.5 mg/dL (ref 0.3–1.2)
Total Protein: 7.7 g/dL (ref 6.5–8.1)

## 2016-03-26 LAB — ACETAMINOPHEN LEVEL: Acetaminophen (Tylenol), Serum: <10 ug/mL — ABNORMAL LOW (ref 10–30)

## 2016-03-26 LAB — SALICYLATE LEVEL

## 2016-03-26 LAB — ETHANOL: Alcohol, Ethyl (B): 92 mg/dL — ABNORMAL HIGH (ref <5)

## 2016-03-26 MED ORDER — HALOPERIDOL 0.5 MG PO TABS
2.0000 mg | ORAL_TABLET | Freq: Two times a day (BID) | ORAL | Status: DC
  Administered 2016-03-26 (×2): 2 mg via ORAL
  Filled 2016-03-26 (×3): qty 4

## 2016-03-26 MED ORDER — BENZTROPINE MESYLATE 1 MG PO TABS: 0.5000 mg | ORAL_TABLET | Freq: Two times a day (BID) | ORAL | Status: DC | PRN

## 2016-03-26 MED ORDER — NICOTINE 14 MG/24HR TD PT24
14.0000 mg | MEDICATED_PATCH | Freq: Once | TRANSDERMAL | Status: AC
  Administered 2016-03-26 (×2): 14 mg via TRANSDERMAL
  Filled 2016-03-26: qty 1

## 2016-03-26 MED ORDER — ACETAMINOPHEN 325 MG PO TABS: 650.0000 mg | ORAL_TABLET | Freq: Four times a day (QID) | ORAL | Status: DC | PRN

## 2016-03-26 MED ORDER — MAGNESIUM HYDROXIDE 400 MG/5ML PO SUSP: 30.0000 mL | Freq: Every day | ORAL | Status: DC | PRN

## 2016-03-26 MED ORDER — ALUM & MAG HYDROXIDE-SIMETH 200-200-20 MG/5ML PO SUSP: 30.0000 mL | ORAL | Status: DC | PRN

## 2016-03-26 MED ORDER — HALOPERIDOL 2 MG PO TABS
2.0000 mg | ORAL_TABLET | Freq: Two times a day (BID) | ORAL | Status: DC
  Administered 2016-03-27 – 2016-04-01 (×11): 2 mg via ORAL
  Filled 2016-03-26 (×11): qty 1

## 2016-03-26 MED ORDER — NICOTINE 14 MG/24HR TD PT24
MEDICATED_PATCH | TRANSDERMAL | Status: AC
  Filled 2016-03-26: qty 1

## 2016-03-26 NOTE — ED Notes (Signed)
Pt taking a shower 
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
## 2016-03-26 NOTE — Consult Note (Signed)
[facility] Consult   Reason for Consult:  Consult for this 25 year old man presents to the emergency room and on IVC after becoming agitated and aggressive at home while intoxicated Referring Physician:  Cinda Quest Patient Identification: Joshua [name] MRN:  [id number] Principal Diagnosis: Schizophrenia [facility]) Diagnosis:   Patient Active Problem List   Diagnosis Date Noted  . Schizophrenia ([place]) [F20.9] 03/26/2016  . Alcohol abuse [F10.10] 03/26/2016  . Marijuana abuse [F12.10] 03/26/2016  . Alcohol use disorder, moderate, dependence ([place]) [F10.20]     Total Time spent with patient: 1 hour  Subjective:   Joshua [name] is a 25 y.o. male patient admitted with "I know I'm going to hurt someone".  HPI:  Patient interviewed. Chart reviewed. Labs and vitals reviewed. 25 year old man was brought in after family reported he became agitated at home. Patient appears to have pretty good insight about the situation. He says he's been drinking more and more recently. Several times a week he drinks 2 or more of the 40 ounce size beers. He says when he drinks he starts to black out and gets crazy. He will get paranoid and start seeing and hearing things. Yesterday he escalated into agitation over something trivial at home and wound up throwing a mug that broke a piece of a kitchen appliance. Patient denies he's having any thoughts of hurting himself and denies that he wants to hurt anyone else. He says he also uses marijuana regularly. When he is not using he still occasionally has auditory hallucinations. He is not on any psychiatric medicine currently. Minimal support not getting any outpatient treatment. Doesn't do much during the day. Sleeps poorly at night.  Medical history: No significant known medical problems outside of the alcohol and drug abuse.  Social history: Patient lives with his mother and 3 younger sisters. He is not currently working. Says that he used to work at a  [employer] but it was way too stressful for him. Sounds like he has never really functioned well since high school. Has lived in [place] and [place] in the past which is where he got prior psychiatric treatment. Only been here in [place] about a year.  Substance abuse history: Long history of alcohol abuse and marijuana abuse. Denies abuse of other drugs. No histories of DTs or seizures. He knows he has blackouts. He's never been in substance abuse treatment but has been able to stop for up to a month on his own.  Past Psychiatric History: He has had hospitalizations in [place]. Sounds like he's been told he had either schizophrenia or bipolar disorder in the past and was on medicine years ago but hasn't taken any since then. Can't remember exactly what it was. He does have a past history of suicide attempts in the past in fact even tried killing himself by drinking bleach within the last couple months. Denies that he is ever seriously been violent anyone else.  Risk to Self: Suicidal Ideation: No Is patient at risk for suicide?: No Suicidal Plan?: No Access to Means: No What has been your use of drugs/alcohol within the last 12 months?: Use of marijuana and alcohol How many times?: 8 Other Self Harm Risks: denied Triggers for Past Attempts: Unknown Intentional Self Injurious Behavior: None Risk to Others: Homicidal Ideation: No Thoughts of Harm to Others: No Current Homicidal Intent: No Current Homicidal Plan: No Access to Homicidal Means: No Identified Victim: None identified History of harm to others?: No Assessment of Violence: On admission Violent Behavior Description:  aggression towards objects Does patient have access to weapons?: No (In hospital) Criminal Charges Pending?: No Does patient have a court date: No Prior Inpatient Therapy: Prior Inpatient Therapy: Yes Prior Therapy Dates: 2010 Prior Therapy Facilty/Provider(s): [facility], [facility] ([place]) Reason for  Treatment: Bipolar Disorder, Paranoid Schizophrenia Prior Outpatient Therapy: Prior Outpatient Therapy: No Prior Therapy Dates: n/a Prior Therapy Facilty/Provider(s): n/a Reason for Treatment: n/a Does patient have an ACCT team?: No Does patient have Intensive In-House Services?  : No Does patient have [facility] services? : No Does patient have P4CC services?: No  Past Medical History:  Past Medical History  Diagnosis Date  . Bipolar 1 disorder ([place])   . Schizophrenia ([place])    History reviewed. No pertinent past surgical history. Family History: No family history on file. Family Psychiatric  History: Patient says there are a great many people in his family with alcohol and drug problems no other known mental health history. Social History:  History  Alcohol Use  . Yes    Comment: pt states does not drink every day, but drank 2 40's today.     History  Drug Use  . Yes  . Special: Marijuana    Social History   Social History  . Marital Status: Single    Spouse Name: N/A  . Number of Children: N/A  . Years of Education: N/A   Social History Main Topics  . Smoking status: Current Every Day Smoker -- 1.00 packs/day for 5 years    Types: Cigarettes  . Smokeless tobacco: None  . Alcohol Use: Yes     Comment: pt states does not drink every day, but drank 2 40's today.  . Drug Use: Yes    Special: Marijuana  . Sexual Activity: Not Asked   Other Topics Concern  . None   Social History Narrative   Additional Social History:    Allergies:  No Known Allergies  Labs:  Results for orders placed or performed during the hospital encounter of 03/26/16 (from the past 48 hour(s))  Comprehensive metabolic panel     Status: Abnormal   Collection Time: 03/26/16 12:18 AM  Result Value Ref Range   Sodium 136 135 - 145 mmol/L   Potassium 3.7 3.5 - 5.1 mmol/L   Chloride 106 101 - 111 mmol/L   CO2 20 (L) 22 - 32 mmol/L   Glucose, Bld 108 (H) 65 - 99 mg/dL   BUN 13 6 - 20 mg/dL    Creatinine, Ser 0.97 0.61 - 1.24 mg/dL   Calcium 9.0 8.9 - 10.3 mg/dL   Total Protein 7.7 6.5 - 8.1 g/dL   Albumin 5.0 3.5 - 5.0 g/dL   AST 25 15 - 41 U/L   ALT 17 17 - 63 U/L   Alkaline Phosphatase 64 38 - 126 U/L   Total Bilirubin 0.5 0.3 - 1.2 mg/dL   GFR calc non Af Amer >60 >60 mL/min   GFR calc Af Amer >60 >60 mL/min    Comment: (NOTE) The eGFR has been calculated using the CKD EPI equation. This calculation has not been validated in all clinical situations. eGFR's persistently <60 mL/min signify possible Chronic Kidney Disease.    Anion gap 10 5 - 15  Ethanol (ETOH)     Status: Abnormal   Collection Time: 03/26/16 12:18 AM  Result Value Ref Range   Alcohol, Ethyl (B) 92 (H) <5 mg/dL    Comment:        LOWEST DETECTABLE LIMIT FOR SERUM ALCOHOL IS  5 mg/dL FOR MEDICAL PURPOSES ONLY   Salicylate level     Status: None   Collection Time: 03/26/16 12:18 AM  Result Value Ref Range   Salicylate Lvl <9.3 2.8 - 30.0 mg/dL  Acetaminophen level     Status: Abnormal   Collection Time: 03/26/16 12:18 AM  Result Value Ref Range   Acetaminophen (Tylenol), Serum <10 (L) 10 - 30 ug/mL    Comment:        THERAPEUTIC CONCENTRATIONS VARY SIGNIFICANTLY. A RANGE OF 10-30 ug/mL MAY BE AN EFFECTIVE CONCENTRATION FOR MANY PATIENTS. HOWEVER, SOME ARE BEST TREATED AT CONCENTRATIONS OUTSIDE THIS RANGE. ACETAMINOPHEN CONCENTRATIONS >150 ug/mL AT 4 HOURS AFTER INGESTION AND >50 ug/mL AT 12 HOURS AFTER INGESTION ARE OFTEN ASSOCIATED WITH TOXIC REACTIONS.   CBC     Status: Abnormal   Collection Time: 03/26/16 12:18 AM  Result Value Ref Range   WBC 13.7 (H) 3.8 - 10.6 K/uL   RBC 4.92 4.40 - 5.90 MIL/uL   Hemoglobin 14.1 13.0 - 18.0 g/dL   HCT 42.7 40.0 - 52.0 %   MCV 86.7 80.0 - 100.0 fL   MCH 28.6 26.0 - 34.0 pg   MCHC 33.0 32.0 - 36.0 g/dL   RDW 13.4 11.5 - 14.5 %   Platelets 250 150 - 440 K/uL  Urine Drug Screen, Qualitative ([place] only)     Status: Abnormal   Collection Time:  03/26/16 12:18 AM  Result Value Ref Range   Tricyclic, Ur Screen NONE DETECTED NONE DETECTED   Amphetamines, Ur Screen NONE DETECTED NONE DETECTED   MDMA (Ecstasy)Ur Screen NONE DETECTED NONE DETECTED   Cocaine Metabolite,Ur [place] NONE DETECTED NONE DETECTED   Opiate, Ur Screen NONE DETECTED NONE DETECTED   Phencyclidine (PCP) Ur S NONE DETECTED NONE DETECTED   Cannabinoid 50 Ng, Ur [place] POSITIVE (A) NONE DETECTED   Barbiturates, Ur Screen NONE DETECTED NONE DETECTED   Benzodiazepine, Ur Scrn NONE DETECTED NONE DETECTED   Methadone Scn, Ur NONE DETECTED NONE DETECTED    Comment: (NOTE) 790  Tricyclics, urine               Cutoff 1000 ng/mL 200  Amphetamines, urine             Cutoff 1000 ng/mL 300  MDMA (Ecstasy), urine           Cutoff 500 ng/mL 400  Cocaine Metabolite, urine       Cutoff 300 ng/mL 500  Opiate, urine                   Cutoff 300 ng/mL 600  Phencyclidine (PCP), urine      Cutoff 25 ng/mL 700  Cannabinoid, urine              Cutoff 50 ng/mL 800  Barbiturates, urine             Cutoff 200 ng/mL 900  Benzodiazepine, urine           Cutoff 200 ng/mL 1000 Methadone, urine                Cutoff 300 ng/mL 1100 1200 The urine drug screen provides only a preliminary, unconfirmed 1300 analytical test result and should not be used for non-medical 1400 purposes. Clinical consideration and professional judgment should 1500 be applied to any positive drug screen result due to possible 1600 interfering substances. A more specific alternate chemical method 1700 must be used in order to obtain a confirmed analytical result.  1800  Gas chromato graphy / mass spectrometry (GC/MS) is the preferred 1900 confirmatory method.     Current Facility-Administered Medications  Medication Dose Route Frequency Provider Last Rate Last Dose  . benztropine (COGENTIN) tablet 0.5 mg  0.5 mg Oral BID PRN Gonzella Lex, MD      . haloperidol (HALDOL) tablet 2 mg  2 mg Oral BID Gonzella Lex, MD        No current outpatient prescriptions on file.    Musculoskeletal: Strength & Muscle Tone: within normal limits Gait & Station: normal Patient leans: N/A  Psychiatric Specialty Exam: Review of Systems  Constitutional: Negative.   HENT: Negative.   Eyes: Negative.   Respiratory: Negative.   Cardiovascular: Negative.   Gastrointestinal: Negative.   Musculoskeletal: Negative.   Skin: Negative.   Neurological: Negative.   Psychiatric/Behavioral: Positive for hallucinations and substance abuse. Negative for depression, suicidal ideas and memory loss. The patient is nervous/anxious and has insomnia.     Blood pressure 110/73, pulse 67, temperature 98 F (36.7 C), temperature source Oral, resp. rate 20, height 6' 1"  (1.854 m), weight 61.236 kg (135 lb), SpO2 99 %.Body mass index is 17.82 kg/(m^2).  General Appearance: Disheveled  Eye Contact::  Good  Speech:  Slow  Volume:  Decreased  Mood:  Dysphoric  Affect:  Constricted  Thought Process:  Tangential  Orientation:  Full (Time, Place, and Person)  Thought Content:  Hallucinations: Auditory  Suicidal Thoughts:  No  Homicidal Thoughts:  Yes.  without intent/plan  Memory:  Immediate;   Good Recent;   Good Remote;   Good  Judgement:  Fair  Insight:  Fair  Psychomotor Activity:  Decreased  Concentration:  Fair  Recall:  [employer] of Knowledge:Fair  Language: Fair  Akathisia:  No  Handed:  Right  AIMS (if indicated):     Assets:  Desire for Improvement Housing Physical Health Resilience Social Support  ADL's:  Intact  Cognition: WNL  Sleep:      Treatment Plan Summary: Daily contact with patient to assess and evaluate symptoms and progress in treatment, Medication management and Plan Patient was schizophrenia and alcohol abuse. No outpatient treatment. Escalating dangerous behavior. Patient will benefit from and needs hospital level treatment. Admit to psychiatric hospital. Continuous observation for now. Start Haldol  for psychosis. Cogentin when necessary. Psychoeducation with the patient he agrees to the plan. He can be engaged in substance abuse treatment downstairs.  Disposition: Recommend psychiatric Inpatient admission when medically cleared. Supportive therapy provided about ongoing stressors.  Alethia Berthold, MD 03/26/2016 11:35 AM

## 2016-03-26 NOTE — Progress Notes (Addendum)
[facility] Dr. Toni Amendlapacs.  Attending Physician will be Dr. Jenne CampusMcQueen.   Patient has [facility] to room 323[facility] Gwen.   Intake Paper Work has been signed and placed on patient chart.  ER staff is aware of the admission ( [facility] ER MD; Irish Lackuthie Patient's Nurse & Byrd HesselbachMaria Patient Access).  Please call report after 7 PM    03/26/2016 Cheryl FlashNicole Iwalani Templeton, MS, NCC, LPCA Therapeutic Triage Specialist

## 2016-03-26 NOTE — ED Notes (Signed)
[facility] Cynthia [facility]

## 2016-03-26 NOTE — ED Notes (Addendum)
Pt reports he was brought in by bpd.  Pt states he got into an argument with mother and sister tonight and threw a glass.  Mother threw him out of house.  Pt admits to drinking 2 40oz beers tonight.  Smoked marijuana yesterday.  Denies other drug use.  Pt reports hearing voices.  Denies SI or HI .  No meds for 5-6 years.  Pt calm and cooperative .

## 2016-03-26 NOTE — ED Notes (Signed)
PT C/O PATCH CAME OFF ON SHOWER HE THREW IT OUT I REPLACED IT

## 2016-03-26 NOTE — ED Notes (Signed)
Pt offers no c/o is pleasant and coop , he states he feels much better in general

## 2016-03-26 NOTE — ED Notes (Signed)
Pt is awake and alert this evening sitting in room watching TV. Pt mood is sad, but he is pleasant and cooperative with staff. Writer discussed tx plan for inpatient admission and pt verbalized understanding. Food and drink provided and 15 minute checks are ongoing for safety.

## 2016-03-26 NOTE — ED Notes (Signed)

## 2016-03-26 NOTE — ED Notes (Signed)
Pt was able to reach his mother at [phone] Joshua [name] and inform her of admission ,and asked her to bring clothes

## 2016-03-26 NOTE — ED Notes (Signed)
Pt. To BHU from ED ambulatory without difficulty, to room 3. Report from Amy RN. Pt. Is alert and oriented, warm and dry in no distress. Pt. Denies SI, HI, and AVH. Pt. Calm and cooperative. Pt. Made aware of security cameras and Q15 minute rounds. Pt. Encouraged to let Nursing staff know of any concerns or needs.

## 2016-03-26 NOTE — Progress Notes (Signed)
Pt being reviewed for possible admission to [facility]. H&P and Assessment have been faxed to the BHU for the charge nurse to review and provide bed assignment.      Nicole Jerlyn Pain, MS, NCC, LPCA Therapeutic Triage Specialist    

## 2016-03-26 NOTE — BH Assessment (Signed)
Assessment Note  Joshua [name] is an 25 y.o. male. [name] arrived to the ED by way of [facility] [employer].  He reports that his sister got into it with his mom and then he got upset due to his sister being disrespectful to his mom, then mom got upset he was mad at his sister, and he walked off. When he returned they did not want to let him back in, but they did. He was angry and kicked the table and threw a cup at stove and broke the glass of the oven. Mother stated that she was going to call the police and he stated that he waited for them to come. He states that at that time the police asked him if he wanted to go to the ED to talk to someone, and he reports that he thought that this was for the best and the officers brought him to the ED.  He states "I may be a little depressed". He shared that tonights actions were out of character for him. He denied symptoms of anxiety.  He denied current auditory or visual hallucinations, but states that he has in the past, with hallucinations about once or twice a month. He states that he has paranoid thoughts when he uses marijuana. He denied suicidal ideation or intent.  He reports last use of marijuana as 03/24/2016. He states that he had 2- 40 oz beers 03/25/2016. BAL= 92, UDS positive for cannabis. Mr. Hoak reports that he has had multiple suicide attempts, last incident within the past few months. He reports a prior diagnosis of paranoid schizophrenia and bipolar disorder.  He states that he was on medication, but has not taken his medications for 5-6 years.  Diagnosis: Bipolar Disorder  Past Medical History:  Past Medical History  Diagnosis Date  . Bipolar 1 disorder ([facility])   . Schizophrenia ([facility])     History reviewed. No pertinent past surgical history.  Family History: No family history on file.  Social History:  reports that he has been smoking Cigarettes.  He has a 5 pack-year smoking history. He does not have any smokeless  tobacco history on file. He reports that he drinks alcohol. He reports that he uses illicit drugs (Marijuana).  Additional Social History:  Alcohol / Drug Use History of alcohol / drug use?: Yes Substance #1 Name of Substance 1: Marijuana 1 - Age of First Use: 17 1 - Amount (size/oz): 1 blunt 1 - Frequency: every 2 weeks 1 - Last Use / Amount: 03/24/2016 Substance #2 Name of Substance 2: Alcohol 2 - Age of First Use: 14 2 - Amount (size/oz): 2-40 oz beers or more 2 - Frequency: 3-4 days a week 2 - Last Use / Amount: 03/25/2016  CIWA: CIWA-Ar BP: 115/75 mmHg Pulse Rate: 89 COWS:    Allergies: No Known Allergies  Home Medications:  (Not in a hospital admission)  OB/GYN Status:  No LMP for male patient.  General Assessment Data Location of Assessment: [facility] ED TTS Assessment: In system Is this a Tele or Face-to-Face Assessment?: Face-to-Face Is this an Initial Assessment or a Re-assessment for this encounter?: Initial Assessment Marital status: Single Maiden name: n/a Is patient pregnant?: No Pregnancy Status: No Living Arrangements: Parent (Mother -    Put him out this evening) Can pt return to current living arrangement?: No Admission Status: Voluntary Is patient capable of signing voluntary admission?: Yes Referral Source: Self/Family/Friend Insurance type: None  Medical Screening Exam [facility] Walk-in ONLY) Medical Exam completed:  Yes  Crisis Care Plan Living Arrangements: Parent (Mother -    Put him out this evening) Legal Guardian: Other: (Self) Name of Psychiatrist: None at this time Name of Therapist: None at this time  Education Status Is patient currently in school?: No Current Grade: n/a Highest grade of school patient has completed: 11th Name of [employer]: [employer] Contact person: n/a  Risk to self with the past 6 months Suicidal Ideation: No Has patient been a risk to self within the past 6 months prior to admission? : Yes Has patient had any suicidal  intent within the past 6 months prior to admission? : Yes Is patient at risk for suicide?: No Suicidal Plan?: No Has patient had any suicidal plan within the past 6 months prior to admission? : Yes Access to Means: No What has been your use of drugs/alcohol within the last 12 months?: Use of marijuana and alcohol Previous Attempts/Gestures: Yes How many times?: 8 Other Self Harm Risks: denied Triggers for Past Attempts: Unknown Intentional Self Injurious Behavior: None Family Suicide History: No Recent stressful life event(s): Conflict (Comment) (family conflict) Persecutory voices/beliefs?: No Depression: Yes Depression Symptoms: Loss of interest in usual pleasures, Feeling worthless/self pity Substance abuse history and/or treatment for substance abuse?: Yes Suicide prevention information given to non-admitted patients: Not applicable  Risk to Others within the past 6 months Homicidal Ideation: No Does patient have any lifetime risk of violence toward others beyond the six months prior to admission? : No Thoughts of Harm to Others: No Current Homicidal Intent: No Current Homicidal Plan: No Access to Homicidal Means: No Identified Victim: None identified History of harm to others?: No Assessment of Violence: On admission Violent Behavior Description: aggression towards objects Does patient have access to weapons?: No (In hospital) Criminal Charges Pending?: No Does patient have a court date: No Is patient on probation?: No  Psychosis Hallucinations: None noted Delusions: None noted  Mental Status Report Appearance/Hygiene: In scrubs Eye Contact: Fair Motor Activity: Unremarkable Speech: Logical/coherent Level of Consciousness: Alert Mood: Worthless, low self-esteem Affect: Appropriate to circumstance Anxiety Level: None Thought Processes: Coherent Judgement: Unimpaired Orientation: Person, Place, Situation, Time Obsessive Compulsive Thoughts/Behaviors:  None  Cognitive Functioning Concentration: Normal Memory: Recent Intact IQ: Average Insight: Fair Impulse Control: Fair Appetite: Good Sleep: Decreased Vegetative Symptoms: None  ADLScreening [facility] Assessment Services) Patient's cognitive ability adequate to safely complete daily activities?: Yes Patient able to express need for assistance with ADLs?: Yes Independently performs ADLs?: Yes (appropriate for developmental age)  Prior Inpatient Therapy Prior Inpatient Therapy: Yes Prior Therapy Dates: 2010 Prior Therapy Facilty/Provider(s): [facility], [facility] [place] Reason for Treatment: Bipolar Disorder, Paranoid Schizophrenia  Prior Outpatient Therapy Prior Outpatient Therapy: No Prior Therapy Dates: n/a Prior Therapy Facilty/Provider(s): n/a Reason for Treatment: n/a Does patient have an ACCT team?: No Does patient have Intensive In-House Services?  : No Does patient have [facility] services? : No Does patient have P4CC services?: No  ADL Screening (condition at time of admission) Patient's cognitive ability adequate to safely complete daily activities?: Yes Patient able to express need for assistance with ADLs?: Yes Independently performs ADLs?: Yes (appropriate for developmental age)       Abuse/Neglect Assessment (Assessment to be complete while patient is alone) Physical Abuse: Denies Verbal Abuse: Denies Sexual Abuse: Denies Exploitation of patient/patient's resources: Denies Self-Neglect: Denies Values / Beliefs Cultural Requests During Hospitalization: None Spiritual Requests During Hospitalization: None   Advance Directives (For Healthcare) Does patient have an advance directive?: No Would patient like information  on creating an advanced directive?: No - patient declined information    Additional Information 1:1 In Past 12 Months?: No CIRT Risk: No Elopement Risk: No Does patient have medical clearance?: Yes     Disposition:  Disposition Initial  Assessment Completed for this Encounter: Yes Disposition of Patient: Other dispositions  On Site Evaluation by:   Reviewed with Physician:    Justice Deeds 03/26/2016 1:31 AM

## 2016-03-26 NOTE — ED Notes (Addendum)
Patient ambulatory to triage with steady gait, without difficulty or distress noted, [place] [facility] [place] [facility]; pt reports hearing voices; denies SI or HI; also st wants detox from ETOH; st his mom called police tonight because they were arguing; st "I have anger issues"; st off meds for last 5years; pt calm & cooperative at present; ambulatory to room 20; EDT Shane to dress pt out

## 2016-03-27 DIAGNOSIS — F1994 Other psychoactive substance use, unspecified with psychoactive substance-induced mood disorder: Secondary | ICD-10-CM

## 2016-03-27 DIAGNOSIS — F332 Major depressive disorder, recurrent severe without psychotic features: Secondary | ICD-10-CM

## 2016-03-27 DIAGNOSIS — F1220 Cannabis dependence, uncomplicated: Secondary | ICD-10-CM

## 2016-03-27 DIAGNOSIS — F1020 Alcohol dependence, uncomplicated: Secondary | ICD-10-CM

## 2016-03-27 LAB — LIPID PANEL
CHOL/HDL RATIO: 2.5 ratio
Cholesterol: 153 mg/dL (ref 0–200)
HDL: 61 mg/dL (ref >40)
LDL CALC: 61 mg/dL (ref 0–99)
TRIGLYCERIDES: 155 mg/dL — AB (ref <150)
VLDL: 31 mg/dL (ref 0–40)

## 2016-03-27 LAB — HEMOGLOBIN A1C: Hgb A1c MFr Bld: 5.2 % (ref 4.0–6.0)

## 2016-03-27 LAB — TSH: TSH: 2.819 u[IU]/mL (ref 0.350–4.500)

## 2016-03-27 MED ORDER — THIAMINE HCL 100 MG/ML IJ SOLN
100.0000 mg | Freq: Once | INTRAMUSCULAR | Status: AC
Start: 2016-03-27 — End: 2016-03-27
  Administered 2016-03-27: 100 mg via INTRAMUSCULAR
  Filled 2016-03-27: qty 2

## 2016-03-27 MED ORDER — LOPERAMIDE HCL 2 MG PO CAPS: 2.0000 mg | ORAL_CAPSULE | ORAL | Status: AC | PRN

## 2016-03-27 MED ORDER — GABAPENTIN 100 MG PO CAPS
100.0000 mg | ORAL_CAPSULE | Freq: Every day | ORAL | Status: DC
  Administered 2016-03-27 – 2016-03-31 (×5): 100 mg via ORAL
  Filled 2016-03-27 (×5): qty 1

## 2016-03-27 MED ORDER — ADULT MULTIVITAMIN W/MINERALS CH
1.0000 | ORAL_TABLET | Freq: Every day | ORAL | Status: DC
  Administered 2016-03-27 – 2016-04-01 (×6): 1 via ORAL
  Filled 2016-03-27 (×6): qty 1

## 2016-03-27 MED ORDER — CHLORDIAZEPOXIDE HCL 25 MG PO CAPS: 25.0000 mg | ORAL_CAPSULE | Freq: Four times a day (QID) | ORAL | Status: AC | PRN

## 2016-03-27 MED ORDER — HYDROXYZINE HCL 25 MG PO TABS
25.0000 mg | ORAL_TABLET | Freq: Four times a day (QID) | ORAL | Status: AC | PRN
  Administered 2016-03-27 – 2016-03-29 (×2): 25 mg via ORAL
  Filled 2016-03-27 (×3): qty 1

## 2016-03-27 MED ORDER — VITAMIN B-1 100 MG PO TABS
100.0000 mg | ORAL_TABLET | Freq: Every day | ORAL | Status: DC
Start: 2016-03-28 — End: 2016-04-01
  Administered 2016-03-28 – 2016-04-01 (×5): 100 mg via ORAL
  Filled 2016-03-27 (×5): qty 1

## 2016-03-27 MED ORDER — NICOTINE 21 MG/24HR TD PT24
21.0000 mg | MEDICATED_PATCH | Freq: Every day | TRANSDERMAL | Status: DC
  Administered 2016-03-27 – 2016-04-01 (×6): 21 mg via TRANSDERMAL
  Filled 2016-03-27 (×6): qty 1

## 2016-03-27 MED ORDER — ONDANSETRON 4 MG PO TBDP
4.0000 mg | ORAL_TABLET | Freq: Four times a day (QID) | ORAL | Status: AC | PRN
  Filled 2016-03-27: qty 1
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
## 2016-03-27 NOTE — Tx Team (Signed)
Initial Interdisciplinary Treatment Plan   PATIENT STRESSORS: Substance abuse Traumatic event   PATIENT STRENGTHS: Ability for insight General fund of knowledge Motivation for treatment/growth Physical Health   PROBLEM LIST: Problem List/Patient Goals Date to be addressed Date deferred Reason deferred Estimated date of resolution  Alcohol Abuse 03/26/16     Marijuana Abuse  03/26/16     "I have substance abuse problems" 03/26/16                                          DISCHARGE CRITERIA:  Improved stabilization in mood, thinking, and/or behavior  PRELIMINARY DISCHARGE PLAN: Outpatient therapy  PATIENT/FAMIILY INVOLVEMENT: This treatment plan has been presented to and reviewed with the patient, Joshua [name], and/or family member.  The patient and family have been given the opportunity to ask questions and make suggestions.  Joshua [name] 03/27/2016, 12:41 AM

## 2016-03-27 NOTE — [facility] Group Notes (Signed)
[facility] Group Notes:  (Nursing/MHT/Case Management/Adjunct)  Date:  03/27/2016  Time:  3:57 PM  Type of Therapy:  Psychoeducational Skills  Participation Level:  None  Participation Quality:  came in late, but sat quietly and listened   Modes of Intervention:  Discussion and Education  Summary of Progress/Problems:  Twanna Hymanda C Kayla Weekes 03/27/2016, 3:57 PM

## 2016-03-27 NOTE — Progress Notes (Signed)
D:  Denies SI/HI/AVH.  Verbalizes that he is here because of alcohol and drugs.  Further states that he wants to continue outpatient treatment and a substance abuse program.  Patient affect is blunted facial expression does not change.   A:  Support and encouragement offered.  Scheduled medications administered.  Safety maintained.   R:  Receptive to treatment regiment.

## 2016-03-27 NOTE — [facility] Group Notes (Signed)
[facility] LCSW Group Therapy  03/27/2016 4:03 PM  Type of Therapy:  Group Therapy  Participation Level:  Active  Participation Quality:  Appropriate  Affect:  Appropriate  Cognitive:  Appropriate  Insight:  Improving  Engagement in Therapy:  Engaged  Modes of Intervention:  Activity, Discussion, Exploration, Socialization and Support  Summary of Progress/Problems:Pt attended and participated in group discussion around emotion regulation/recovery/and review of the inside/outside bag activity that patients worked on in previous morning group.  Pt shared appropriately about his bag and described inside/outside activity in an appropriate way that could be understood.  Began to talk about strategies to make the inside and outside match and how this would bring joy and peace both can bring.  Glennon MacLaws, Berdella Bacot P, MSW, LCSW 03/27/2016, 4:03 PM

## 2016-03-27 NOTE — Plan of Care (Signed)
Problem: Alteration in mood & ability to function due to Goal: LTG-Pt reports reduction in suicidal thoughts (Patient reports reduction in suicidal thoughts and is able to verbalize a safety plan for whenever patient is feeling suicidal)  Outcome: Progressing Patient denies SI at this time.     
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
## 2016-03-27 NOTE — [facility] Suicide Risk Assessment (Addendum)
[facility] INPATIENT:  Family/Significant Other Suicide Prevention Education  Suicide Prevention Education:  Patient Refusal for Family/Significant Other Suicide Prevention Education: The patient Joshua [name] has refused to provide written consent for family/significant other to be provided Family/Significant Other Suicide Prevention Education during admission and/or prior to discharge.  Physician notified.  CSW completed SPE with the pt.  Dorothe PeaJonathan F Derrick Orris 03/27/2016, 3:29 PM

## 2016-03-27 NOTE — [facility] Group Notes (Signed)
[facility] Group Notes:  (Nursing/MHT/Case Management/Adjunct)  Date:  03/27/2016  Time:  9:28 PM  Type of Therapy:  Group Therapy  Participation Level:  Active  Participation Quality:  Appropriate  Affect:  Appropriate  Cognitive:  Appropriate  Insight:  Appropriate  Engagement in Group:  Engaged  Modes of Intervention:  Discussion  Summary of Progress/Problems:  Ethel RanaSherrell M Edy Mcbane 03/27/2016, 9:28 PM

## 2016-03-27 NOTE — [facility] Group Notes (Signed)
[facility] LCSW Group Therapy  03/27/2016 4:01 PM  Type of Therapy:  Group Therapy  Participation Level: Active  Participation Quality:  Appropriate  Affect:  Appropriate  Cognitive:  Appropriate  Insight:  Improving  Engagement in Therapy:  Engaged  Modes of Intervention:  Activity, Discussion, Socialization and Support  Summary of Progress/Problems:Pt attended and participated in morning activity where group discussed incongruence of the way we allow ourselves to be perceived versus the real us on the inside.  Group completed Inside/outside activity decorating outside of paper bag with other's perceptions or what we allow to show and filled the inside with images and words that describe the true inside.  Group discussed the discomfort these incongruent parts can cause and strategies to begin to make them match.  Joshua [name], Joshua [name], MSW, LCSW 03/27/2016, 4:01 PM

## 2016-03-27 NOTE — Tx Team (Signed)
Interdisciplinary Treatment Plan Update (Adult)        Date: 03/27/2016   Time Reviewed: 9:30 AM   Progress in Treatment: Improving  Attending groups: Continuing to assess, patient new to milieu  Participating in groups: Continuing to assess, patient new to milieu  Taking medication as prescribed: Yes  Tolerating medication: Yes  Family/Significant other contact made: No, CSW assessing for appropriate contacts  Patient understands diagnosis: Yes  Discussing patient identified problems/goals with staff: Yes  Medical problems stabilized or resolved: Yes  Denies suicidal/homicidal ideation: Yes  Issues/concerns per patient self-inventory: Yes  Other:   New problem(s) identified: N/A   Discharge Plan or Barriers: Pt plans to discharge home to live with his mother and follow up with [facility] for medication management and therapy  Reason for Continuation of Hospitalization:   Depression   Anxiety   Medication Stabilization   Comments: N/A   Estimated length of stay: 3-5 days     Pt is a 25 y.o. male patient admitted with "I know I'm going to hurt someone".  HPI: Patient interviewed. Chart reviewed. Labs and vitals reviewed. 25 year old man was brought in after family reported he became agitated at home. Patient appears to have pretty good insight about the situation. He says he's been drinking more and more recently. Several times a week he drinks 2 or more of the 40 ounce size beers. He says when he drinks he starts to black out and gets crazy. He will get paranoid and start seeing and hearing things. Yesterday he escalated into agitation over something trivial at home and wound up throwing a mug that broke a piece of a kitchen appliance. Patient denies he's having any thoughts of hurting himself and denies that he wants to hurt anyone else. He says he also uses marijuana regularly. When he is not using he still occasionally has auditory hallucinations. He is not on any psychiatric medicine  currently. Minimal support not getting any outpatient treatment. Doesn't do much during the day. Sleeps poorly at night.  Medical history: No significant known medical problems outside of the alcohol and drug abuse.  Social history: Patient lives with his mother and 3 younger sisters. He is not currently working. Says that he used to work at a [employer] but it was way too stressful for him. Sounds like he has never really functioned well since high school. Has lived in [place] and [place] in the past which is where he got prior psychiatric treatment. Only been here in [place] about a year.  Substance abuse history: Long history of alcohol abuse and marijuana abuse. Denies abuse of other drugs. No histories of DTs or seizures. He knows he has blackouts. He's never been in substance abuse treatment but has been able to stop for up to a month on his own.  Past Psychiatric History: He has had hospitalizations in [place]. Sounds like he's been told he had either schizophrenia or bipolar disorder in the past and was on medicine years ago but hasn't taken any since then. Can't remember exactly what it was. He does have a past history of suicide attempts in the past in fact even tried killing himself by drinking bleach within the last couple months. Denies that he is ever seriously been violent anyone else.  Patient will benefit from crisis stabilization, medication evaluation, group therapy, and psycho education in addition to case management for discharge planning. Patient and CSW reviewed pt's identified goals and treatment plan. Pt verbalized understanding and agreed to  treatment plan.    Review of initial/current patient goals per problem list:  1. Goal(s): Patient will participate in aftercare plan   Met: Yes  Target date: 3-5 days post admission date   As evidenced by: Patient will participate within aftercare plan AEB aftercare provider and housing plan at discharge being identified.   4/12:  Pt plans to discharge home to live with his mother and follow up with [facility] for medication management and therapy   2. Goal (s): Patient will exhibit decreased depressive symptoms and suicidal ideations.   Met: No  Target date: 3-5 days post admission date   As evidenced by: Patient will utilize self-rating of depression at 3 or below and demonstrate decreased signs of depression or be deemed stable for discharge by MD.   4/12: Goal progressing.  Pt denies SI/HI.      3. Goal(s): Patient will demonstrate decreased signs and symptoms of anxiety.   Met: No  Target date: 3-5 days post admission date   As evidenced by: Patient will utilize self-rating of anxiety at 3 or below and demonstrated decreased signs of anxiety, or be deemed stable for discharge by MD   4/12: Goal progressing.    4. Goal(s): Patient will demonstrate decreased signs of withdrawal due to substance abuse   Met: Yes  Target date: 3-5 days post admission date   As evidenced by: Patient will produce a CIWA/COWS score of 0, have stable vitals signs, and no symptoms of withdrawal   4/12: Patient produced a CIWA/COWS score of 0, has stable vitals signs, and no symptoms of withdrawal     5. Goal(s): Patient will demonstrate decreased signs of psychosis  * Met: No * Target date: 3-5 days post admission date  * As evidenced by: Patient will demonstrate decreased frequency of AVH or return to baseline function   4/12: Goal progressing.     Attendees:  Patient:  Family:  Physician: Dr. Tami Ribas, MD       03/27/2016 9:30 AM  Nursing: Carolynn Sayers, RN       03/27/2016 9:30 AM  Clinical Social Worker: Marylou Flesher, [place]     03/27/2016 9:30 AM  Clinical Social Worker: Nile Riggs, LCSW     03/27/2016 9:30 AM  Psychologist: Byrd Hesselbach Roush       03/27/2016 9:30 AM  Other:           03/27/2016 9:30 AM  Other:           03/27/2016 9:30 AM

## 2016-03-28 LAB — PROLACTIN: PROLACTIN: 31.8 ng/mL — AB (ref 4.0–15.2)

## 2016-03-28 MED ORDER — GABAPENTIN 250 MG/5ML PO SOLN
50.0000 mg | Freq: Two times a day (BID) | ORAL | Status: DC
  Administered 2016-03-29 – 2016-04-01 (×7): 50 mg via ORAL
  Filled 2016-03-28 (×10): qty 1

## 2016-03-28 NOTE — Plan of Care (Signed)
Problem: Alteration in mood & ability to function due to Goal: LTG-Pt reports reduction in suicidal thoughts (Patient reports reduction in suicidal thoughts and is able to verbalize a safety plan for whenever patient is feeling suicidal)  Outcome: Progressing Patient denies SI.      

## 2016-03-28 NOTE — [facility] Group Notes (Signed)
[facility] Group Notes:  (Nursing/MHT/Case Management/Adjunct)  Date:  03/28/2016  Time:  5:03 PM  Type of Therapy:  Psychoeducational Skills  Participation Level:  Active  Participation Quality:  Appropriate, Attentive and Supportive  Affect:  Appropriate  Cognitive:  Appropriate  Insight:  Appropriate  Engagement in Group:  Supportive  Modes of Intervention:  Discussion and Education  Summary of Progress/Problems:  Joshua [name] 03/28/2016, 5:03 PM

## 2016-03-28 NOTE — Progress Notes (Signed)
D: Pt denies SI/HI/AVH. Pt is pleasant and cooperative. Pt appears less anxious and he is interacting with peers and staff appropriately.  A: Pt was offered support and encouragement. Pt was given scheduled medications. Pt was encouraged to attend groups. Q 15 minute checks were done for safety.  R:Pt attends groups and interacts well with peers and staff. Pt is taking medication. Pt has no complaints.Pt receptive to treatment and safety maintained on unit.   

## 2016-03-28 NOTE — Progress Notes (Signed)
Denies SI/HI/AVH.  Pleasant and cooperative.  Maintains appropriate personal hygiene.  Medication and group compliant.  Safety maintained.  Support and encouragement offered.

## 2016-03-28 NOTE — Progress Notes (Signed)
[facility]  03/28/2016 11:00 AM [name]  MRN:  [id number]   Subjective:  Nursing notes reviewed.   Pt admitted for mood derangements and alcohol use disorder, severe. Today on interview, the pt feels he is improving. He shared he is calm. He currently denies cravings for alcohol and feels gabapentin helped with cravings. Pt reports he slept well but does feel somewhat fatigued this morning.  He denies SI, HI and AVH. Pt denies withdrawals from alcohol.   He continues to require inpatient psychiatric hospitalization to adjust medications for mood disorder.   Principal Problem: <principal problem not specified> Diagnosis:   Patient Active Problem List   Diagnosis Date Noted  . Schizophrenia (HCC) [F20.9] 03/26/2016  . Alcohol abuse [F10.10] 03/26/2016  . Marijuana abuse [F12.10] 03/26/2016  . Alcohol use disorder, moderate, dependence (HCC) [F10.20]    Total Time spent with patient: 15 minutes   Past Medical History:  Past Medical History  Diagnosis Date  . Bipolar 1 disorder (HCC)   . Schizophrenia (HCC)    History reviewed. No pertinent past surgical history. Family History: History reviewed. No pertinent family history. Social History:  History  Alcohol Use  . Yes    Comment: pt states does not drink every day, but drank 2 40's today.     History  Drug Use  . Yes  . Special: Marijuana    Social History   Social History  . Marital Status: Single    Spouse Name: N/A  . Number of Children: N/A  . Years of Education: N/A   Social History Main Topics  . Smoking status: Current Every Day Smoker -- 1.00 packs/day for 5 years    Types: Cigarettes  . Smokeless tobacco: None  . Alcohol Use: Yes     Comment: pt states does not drink every day, but drank 2 40's today.  . Drug Use: Yes    Special: Marijuana  . Sexual Activity: Not Asked   Other Topics Concern  . None   Social History Narrative   Additional History:    Sleep: Good  Appetite:   Good   Assessment:   Musculoskeletal: Strength & Muscle Tone: within normal limits Gait & Station: normal Patient leans: N/A   Psychiatric Specialty Exam: Physical Exam  ROS  Blood pressure 120/82, pulse 68, temperature 98.6 F (37 C), temperature source Oral, resp. rate 20, height 6' 0.64" (1.845 m), weight 62.143 kg (137 lb), SpO2 100 %.Body mass index is 18.26 kg/(m^2).  General Appearance: Casual  Eye Contact::  Good  Speech:  Clear and Coherent and Normal Rate  Volume:  Normal  Mood:  "I'm fine."  Affect:  Restricted  Thought Process:  Coherent, Goal Directed, Intact, Linear and Logical  Orientation:  Full (Time, Place, and Person)  Thought Content:  Negative  Suicidal Thoughts:  No  Homicidal Thoughts:  No  Memory:  Immediate;   Good Recent;   Good Remote;   Good  Judgement:  Poor  Insight:  Fair  Psychomotor Activity:  Normal  Concentration:  Good  Recall:  Good  Fund of Knowledge:Good  Language: Good  Cognition: WNL    Akathisia:  No             Current Medications: Current Facility-Administered Medications  Medication Dose Route Frequency Provider Last Rate Last Dose  . acetaminophen (TYLENOL) tablet 650 mg  650 mg Oral Q6H PRN Audery AmelJohn T Clapacs, MD      . alum & mag hydroxide-simeth (MAALOX/MYLANTA)  200-200-20 MG/5ML suspension 30 mL  30 mL Oral Q4H PRN Audery Amel, MD      . benztropine (COGENTIN) tablet 0.5 mg  0.5 mg Oral BID PRN Audery Amel, MD      . chlordiazePOXIDE (LIBRIUM) capsule 25 mg  25 mg Oral Q6H PRN Gena Fray, MD      . gabapentin (NEURONTIN) 250 MG/5ML solution 50 mg  50 mg Oral BID Gena Fray, MD      . gabapentin (NEURONTIN) capsule 100 mg  100 mg Oral QHS Gena Fray, MD   100 mg at 03/27/16 2207  . haloperidol (HALDOL) tablet 2 mg  2 mg Oral BID Audery Amel, MD   2 mg at 03/27/16 2207  . hydrOXYzine (ATARAX/VISTARIL) tablet 25 mg  25 mg Oral Q6H PRN Gena Fray, MD   25 mg at 03/27/16 2207  . loperamide  (IMODIUM) capsule 2-4 mg  2-4 mg Oral PRN Gena Fray, MD      . magnesium hydroxide (MILK OF MAGNESIA) suspension 30 mL  30 mL Oral Daily PRN Audery Amel, MD      . multivitamin with minerals tablet 1 tablet  1 tablet Oral Daily Gena Fray, MD   1 tablet at 03/27/16 1721  . nicotine (NICODERM CQ - dosed in mg/24 hours) patch 21 mg  21 mg Transdermal Daily Audery Amel, MD   21 mg at 03/27/16 2210  . ondansetron (ZOFRAN-ODT) disintegrating tablet 4 mg  4 mg Oral Q6H PRN Gena Fray, MD      . thiamine (VITAMIN B-1) tablet 100 mg  100 mg Oral Daily Gena Fray, MD        Lab Results:  Results for orders placed or performed during the hospital encounter of 03/26/16 (from the past 48 hour(s))  Hemoglobin A1c     Status: None   Collection Time: 03/26/16 11:22 PM  Result Value Ref Range   Hgb A1c MFr Bld 5.2 4.0 - 6.0 %  Lipid panel, fasting     Status: Abnormal   Collection Time: 03/26/16 11:22 PM  Result Value Ref Range   Cholesterol 153 0 - 200 mg/dL   Triglycerides 161 (H) <150 mg/dL   HDL 61 >09 mg/dL   Total CHOL/HDL Ratio 2.5 RATIO   VLDL 31 0 - 40 mg/dL   LDL Cholesterol 61 0 - 99 mg/dL    Comment:        Total Cholesterol/HDL:CHD Risk Coronary Heart Disease Risk Table                     Men   Women  1/2 Average Risk   3.4   3.3  Average Risk       5.0   4.4  2 X Average Risk   9.6   7.1  3 X Average Risk  23.4   11.0        Use the calculated Patient Ratio above and the CHD Risk Table to determine the patient's CHD Risk.        ATP III CLASSIFICATION (LDL):  <100     mg/dL   Optimal  604-540  mg/dL   Near or Above                    Optimal  130-159  mg/dL   Borderline  981-191  mg/dL   High  >478     mg/dL  Very High   Prolactin     Status: Abnormal   Collection Time: 03/26/16 11:22 PM  Result Value Ref Range   Prolactin 31.8 (H) 4.0 - 15.2 ng/mL    Comment: (NOTE) Performed At: [facility] [address] [place], [place]  [id number] Mila Homer MD WU:[id number]   TSH     Status: None   Collection Time: 03/26/16 11:22 PM  Result Value Ref Range   TSH 2.819 0.350 - 4.500 uIU/mL    Physical Findings: AIMS:  , ,  ,  , Dental Status Current problems with teeth and/or dentures?: No Does patient usually wear dentures?: No  CIWA:  CIWA-Ar Total: 3  Treatment Plan Summary: Daily contact with patient to assess and evaluate symptoms and progress in treatment and Medication management  1. Substance induced bipolar and related disorder - improved when pt is not using substances  2. MDD, recurrent, severe and unspecified anxiety disorder: - pt does not appear to have a h/o manic episodes.   - Consider starting fluoxetine  po Qam due to repeated h/o depression.   3. Alcohol use disorder, severe - Start CIWA protocol - Start gabapentin  po BID and  po QHS   4. Cannabis use disorder, moderate - Continue to monitor - Will treat symptomatic occurences.   5. Diet: regular  6. Observation: 15 minute checks  7. Precautions: Seizure  Medical Decision Making:  Established Problem, Stable/Improving (1), Review of Psycho-Social Stressors (1), Review or order clinical lab tests (1), Review of Medication Regimen & Side Effects (2) and Review of New Medication or Change in Dosage (2)   Gena Fray 03/28/2016, 11:00 AM

## 2016-03-28 NOTE — [facility] Group Notes (Signed)
[facility] LCSW Group Therapy  03/28/2016 10:51 AM  Type of Therapy:  Group Therapy  Participation Level:  Active  Participation Quality:  Appropriate, Sharing and Supportive  Affect:  Appropriate  Cognitive:  Alert and Appropriate  Insight:  Engaged  Engagement in Therapy:  Engaged and Supportive  Modes of Intervention:  Discussion, Education, Socialization and Support  Summary of Progress/Problems: Balance in life: Patients will discuss the concept of balance and how it looks and feels to be unbalanced. Pt will identify areas in their life that is unbalanced and ways to become more balanced. Pt attended and participated in group discussion appropriately. Pt identified how not staying positive and engaging in his spirituality negatively impacted his ability to cope with his anger and maintain relationships with family.    Jenel LucksJasmine [name], CSW Intern 03/28/2016, 10:51 AM

## 2016-03-29 NOTE — Progress Notes (Signed)
[facility]  03/29/2016 9:09 AM Joshua RosenthalDashawn [name]  MRN:  [id number]   Subjective:  Nursing notes reviewed.   Pt admitted for mood derangements and alcohol use disorder, severe. Today on interview, the pt is resting comfortably in bed and is easily awakened. He continues to attend groups. Pt is sleeping and eating well. Continues to feel gabapentin is helpful. Reviewed CIWA protocol with pt and medications he received for that.   Pt denies cravings for alcohol. Pt denies symptoms of alcohol withdrawal. Pt has not received gabapentin solution as yet to help with daytime cravings.   Pt reviewed what he learned thus far with hospitalization. He has goals and a plan to help stop alcohol and cannabis use as an outpatient. Pt is interested in attending [facility] for treatment after discharge.   Pt denies medication side effects. Pt currently denies SI, HI and AVH.   Principal Problem: <principal problem not specified> Diagnosis:   Patient Active Problem List   Diagnosis Date Noted  . Schizophrenia (HCC) [F20.9] 03/26/2016  . Alcohol abuse [F10.10] 03/26/2016  . Marijuana abuse [F12.10] 03/26/2016  . Alcohol use disorder, moderate, dependence (HCC) [F10.20]    Total Time spent with patient: 15 minutes   Past Medical History:  Past Medical History  Diagnosis Date  . Bipolar 1 disorder (HCC)   . Schizophrenia (HCC)    History reviewed. No pertinent past surgical history. Family History: History reviewed. No pertinent family history. Social History:  History  Alcohol Use  . Yes    Comment: pt states does not drink every day, but drank 2 40's today.     History  Drug Use  . Yes  . Special: Marijuana    Social History   Social History  . Marital Status: Single    Spouse Name: N/A  . Number of Children: N/A  . Years of Education: N/A   Social History Main Topics  . Smoking status: Current Every Day Smoker -- 1.00 packs/day for 5 years    Types: Cigarettes  . Smokeless  tobacco: None  . Alcohol Use: Yes     Comment: pt states does not drink every day, but drank 2 40's today.  . Drug Use: Yes    Special: Marijuana  . Sexual Activity: Not Asked   Other Topics Concern  . None   Social History Narrative   Additional History:    Sleep: Good  Appetite:  Good   Assessment:   Musculoskeletal: Strength & Muscle Tone: within normal limits Gait & Station: normal Patient leans: N/A   Psychiatric Specialty Exam: Physical Exam  ROS  Blood pressure 113/76, pulse 57, temperature 98.2 F (36.8 C), temperature source Oral, resp. rate 18, height 6' 0.64" (1.845 m), weight 62.143 kg (137 lb), SpO2 100 %.Body mass index is 18.26 kg/(m^2).  General Appearance: Casual  Eye Contact::  Good  Speech:  Clear and Coherent and Normal Rate  Volume:  Normal  Mood:  "I'm fine."  Affect:  Restricted  Thought Process:  Coherent, Goal Directed, Intact, Linear and Logical  Orientation:  Full (Time, Place, and Person)  Thought Content:  Negative  Suicidal Thoughts:  No  Homicidal Thoughts:  No  Memory:  Immediate;   Good Recent;   Good Remote;   Good  Judgement:  Fair  Insight:  Fair  Psychomotor Activity:  Normal  Concentration:  Good  Recall:  Good  Fund of Knowledge:Good  Language: Good  Cognition: WNL    Akathisia:  No  Current Medications: Current Facility-Administered Medications  Medication Dose Route Frequency Provider Last Rate Last Dose  . acetaminophen (TYLENOL) tablet 650 mg  650 mg Oral Q6H PRN Audery Amel, MD      . alum & mag hydroxide-simeth (MAALOX/MYLANTA) 200-200-20 MG/5ML suspension 30 mL  30 mL Oral Q4H PRN Audery Amel, MD      . benztropine (COGENTIN) tablet 0.5 mg  0.5 mg Oral BID PRN Audery Amel, MD      . chlordiazePOXIDE (LIBRIUM) capsule 25 mg  25 mg Oral Q6H PRN Gena Fray, MD      . gabapentin (NEURONTIN) 250 MG/5ML solution 50 mg  50 mg Oral BID Gena Fray, MD      . gabapentin (NEURONTIN)  capsule 100 mg  100 mg Oral QHS Gena Fray, MD   100 mg at 03/28/16 2223  . haloperidol (HALDOL) tablet 2 mg  2 mg Oral BID Audery Amel, MD   2 mg at 03/28/16 2223  . hydrOXYzine (ATARAX/VISTARIL) tablet 25 mg  25 mg Oral Q6H PRN Gena Fray, MD   25 mg at 03/27/16 2207  . loperamide (IMODIUM) capsule 2-4 mg  2-4 mg Oral PRN Gena Fray, MD      . magnesium hydroxide (MILK OF MAGNESIA) suspension 30 mL  30 mL Oral Daily PRN Audery Amel, MD      . multivitamin with minerals tablet 1 tablet  1 tablet Oral Daily Gena Fray, MD   1 tablet at 03/28/16 1200  . nicotine (NICODERM CQ - dosed in mg/24 hours) patch 21 mg  21 mg Transdermal Daily Audery Amel, MD   21 mg at 03/28/16 1200  . ondansetron (ZOFRAN-ODT) disintegrating tablet 4 mg  4 mg Oral Q6H PRN Gena Fray, MD      . thiamine (VITAMIN B-1) tablet 100 mg  100 mg Oral Daily Gena Fray, MD   100 mg at 03/28/16 1200    Lab Results:  No results found for this or any previous visit (from the past 48 hour(s)).  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 MDD, recurrent, severe and unspecified anxiety disorder: - pt does not appear to have a h/o manic episodes.   - Consider starting fluoxetine  po Qam due to repeated h/o depression.   3. Alcohol use disorder, severe - Start CIWA protocol - If gabapentin solution is not available, will change to gabapentin capsule  po BID.   4. Cannabis use disorder, moderate - Continue to monitor - Will treat symptomatic occurences.    5. Diet: regular  6. Observation: 15 minute checks  7. Precautions: Seizure  Medical Decision Making:  Established Problem, Stable/Improving (1), Review of Psycho-Social Stressors (1), Review or order clinical lab tests (1), Review of Medication Regimen & Side Effects (2) and Review of New Medication or Change in Dosage (2)   Gena Fray 03/29/2016, 9:09 AM

## 2016-03-29 NOTE — [facility] Group Notes (Signed)
[facility] Group Notes:  (Nursing/MHT/Case Management/Adjunct)  Date:  03/29/2016  Time:  2:45 AM  Type of Therapy:  Group Therapy  Participation Level:  Active  Participation Quality:  Appropriate  Affect:  Appropriate  Cognitive:  Appropriate  Insight:  Appropriate  Engagement in Group:  Engaged  Modes of Intervention:  n/a  Summary of Progress/Problems:  Veva Holesshley Imani Wiletta Bermingham 03/29/2016, 2:45 AM

## 2016-03-29 NOTE — Plan of Care (Signed)
Problem: Alteration in mood & ability to function due to Goal: LTG-Pt reports reduction in suicidal thoughts (Patient reports reduction in suicidal thoughts and is able to verbalize a safety plan for whenever patient is feeling suicidal)  Outcome: Progressing Patient denies SI.      

## 2016-03-29 NOTE — Tx Team (Signed)
Interdisciplinary Treatment Plan Update (Adult)        Date: 03/29/2016   Time Reviewed: 9:30 AM   Progress in Treatment: Improving  Attending groups: Yes Participating in groups: Yes Taking medication as prescribed: Yes  Tolerating medication: Yes  Family/Significant other contact made: No, CSW assessing for appropriate contacts  Patient understands diagnosis: Yes  Discussing patient identified problems/goals with staff: Yes  Medical problems stabilized or resolved: Yes  Denies suicidal/homicidal ideation: Yes  Issues/concerns per patient self-inventory: Yes  Other:   New problem(s) identified: N/A   Discharge Plan or Barriers: Pt plans to discharge home to live with his mother and follow up with [facility] for medication management and therapy  Reason for Continuation of Hospitalization:   Depression   Anxiety   Medication Stabilization   Comments: N/A   Estimated length of stay: 5-7 days     Pt is a 25 y.o. male patient admitted with "I know I'm going to hurt someone".  HPI: Patient interviewed. Chart reviewed. Labs and vitals reviewed. 25 year old man was brought in after family reported he became agitated at home. Patient appears to have pretty good insight about the situation. He says he's been drinking more and more recently. Several times a week he drinks 2 or more of the 40 ounce size beers. He says when he drinks he starts to black out and gets crazy. He will get paranoid and start seeing and hearing things. Yesterday he escalated into agitation over something trivial at home and wound up throwing a mug that broke a piece of a kitchen appliance. Patient denies he's having any thoughts of hurting himself and denies that he wants to hurt anyone else. He says he also uses marijuana regularly. When he is not using he still occasionally has auditory hallucinations. He is not on any psychiatric medicine currently. Minimal support not getting any outpatient treatment. Doesn't do much  during the day. Sleeps poorly at night.  Medical history: No significant known medical problems outside of the alcohol and drug abuse.  Social history: Patient lives with his mother and 3 younger sisters. He is not currently working. Says that he used to work at a [employer] but it was way too stressful for him. Sounds like he has never really functioned well since high school. Has lived in [place] and [place] in the past which is where he got prior psychiatric treatment. Only been here in [place] about a year.  Substance abuse history: Long history of alcohol abuse and marijuana abuse. Denies abuse of other drugs. No histories of DTs or seizures. He knows he has blackouts. He's never been in substance abuse treatment but has been able to stop for up to a month on his own.  Past Psychiatric History: He has had hospitalizations in [place]. Sounds like he's been told he had either schizophrenia or bipolar disorder in the past and was on medicine years ago but hasn't taken any since then. Can't remember exactly what it was. He does have a past history of suicide attempts in the past in fact even tried killing himself by drinking bleach within the last couple months. Denies that he is ever seriously been violent anyone else.  Patient will benefit from crisis stabilization, medication evaluation, group therapy, and psycho education in addition to case management for discharge planning. Patient and CSW reviewed pt's identified goals and treatment plan. Pt verbalized understanding and agreed to treatment plan.    Review of initial/current patient goals per problem list:  1. Goal(s): Patient will participate in aftercare plan   Met: Yes  Target date: 3-5 days post admission date   As evidenced by: Patient will participate within aftercare plan AEB aftercare provider and housing plan at discharge being identified.   4/12: Pt plans to discharge home to live with his mother and follow up with [facility] for  medication management and therapy   2. Goal (s): Patient will exhibit decreased depressive symptoms and suicidal ideations.   Met: No  Target date: 3-5 days post admission date   As evidenced by: Patient will utilize self-rating of depression at 3 or below and demonstrate decreased signs of depression or be deemed stable for discharge by MD.   4/12: Goal progressing.  Pt denies SI/HI.  4/14: Goal progressing.  Pt denies SI/HI.      3. Goal(s): Patient will demonstrate decreased signs and symptoms of anxiety.   Met: No  Target date: 3-5 days post admission date   As evidenced by: Patient will utilize self-rating of anxiety at 3 or below and demonstrated decreased signs of anxiety, or be deemed stable for discharge by MD   4/12: Goal progressing.  4/14: Goal progressing.    4. Goal(s): Patient will demonstrate decreased signs of withdrawal due to substance abuse   Met: Yes  Target date: 3-5 days post admission date   As evidenced by: Patient will produce a CIWA/COWS score of 0, have stable vitals signs, and no symptoms of withdrawal   4/12: Patient produced a CIWA/COWS score of 0, has stable vitals signs, and no symptoms of withdrawal     5. Goal(s): Patient will demonstrate decreased signs of psychosis  * Met: No * Target date: 3-5 days post admission date  * As evidenced by: Patient will demonstrate decreased frequency of AVH or return to baseline function   4/12: Goal progressing.  4/14: Goal progressing.  Pt reports on occasion he hears voices.    Attendees:  Patient:  Family:  Physician: Dr. Tami Ribas, MD       03/29/2016 9:30 AM  Nursing: Elige Radon, RN        03/29/2016 9:30 AM  Clinical Social Worker: Marylou Flesher, [place]     03/29/2016 9:30 AM  Clinical Social Worker: Nile Riggs, LCSW     03/29/2016 9:30 AM  Psychologist: Byrd Hesselbach Roush       03/29/2016 9:30 AM  Nursing: Floyde Parkins        03/29/2016 9:30 AM  Other:           03/29/2016 9:30 AM   Alphonse Guild.  Tyhesha Dutson, LCSWA 03/29/16

## 2016-03-29 NOTE — [facility] Group Notes (Signed)
[facility]   03/29/2016 12:01 PM  Participation Quality:  Active  Mood/Affect:  Flat  Depression Rating: 0   Anxiety Rating:  0  Thoughts of Suicide:  No Will you contract for safety?   NA  Current AVH:  No  Plan for Discharge/Comments:  Pt plans to return home and follow up with outpatient. He reports he lives with his mother. [facility]   Transportation Means: Family   Supports: Family   Jazion Atteberry [employer] CorporationL Yaseen Gilberg MSW, [employer] IncLCSWA

## 2016-03-29 NOTE — Progress Notes (Signed)
D: Pt denies SI/HI/AVH. Pt is pleasant and cooperative affect flat but brightens upon approach.  Pt appears less  anxious and he is interacting with peers and staff appropriately.  A: Pt was offered support and encouragement. Pt was given scheduled medications. Pt was encouraged to attend groups. Q 15 minute checks were done for safety.  R:Pt attends groups and interacts well with peers and staff. Pt is taking medication. Pt has no complaints.Pt receptive to treatment and safety maintained on unit.

## 2016-03-29 NOTE — Progress Notes (Signed)
D:  Patient is pleasant and cooperative.  Denies SI/HI/AVH.  Denies depression and anxiety.  Visible in the milieu.  Maintaining personal care chores appropriately.  Good appetite.   A:  Scheduled medications given.  Safety maintained by Q 15 minute checks.  Support and encouragement offered.  R:  Receptive to treatment plan.  Medication and group compliant.  Safe environment maintained.

## 2016-03-29 NOTE — [facility] Group Notes (Signed)
[facility] LCSW Group Therapy  03/29/2016 1:54 PM  Type of Therapy: Group Therapy  Participation Level: Minimal  Participation Quality: Attentive  Affect: Flat  Cognitive:Alert   Insight: Limited  Engagement in Therapy: Limited  Modes of Intervention: Discussion, Education, Socialization and Support  Summary of Progress/Problems: Feelings around Relapse. Group members discussed the meaning of relapse and shared personal stories of relapse, how it affected them and others, and how they perceived themselves during this time. Group members were encouraged to identify triggers, warning signs and coping skills used when facing the possibility of relapse. Social supports were discussed and explored in detail. Pt attended group and stayed the entire time. Pt sat quietly and listened to other group members share.   Sempra EnergyCandace L Jaymz Traywick MSW, LCSWA  03/29/2016, 1:54 PM

## 2016-03-30 DIAGNOSIS — F1010 Alcohol abuse, uncomplicated: Secondary | ICD-10-CM

## 2016-03-30 NOTE — Progress Notes (Signed)
D: Pt denies SI/HI/AVH. Pt is pleasant and cooperative, affect flat and sad but brightens upon approach, thoughts are organized no bizarre behaviour noted. . Pt appears less anxious and he is interacting with peers and staff appropriately.  A: Pt was offered support and encouragement. Pt was given scheduled medications. Pt was encouraged to attend groups. Q 15 minute checks were done for safety.  R:Pt attends groups and interacts well with peers and staff. Pt is taking medication. Pt has no complaints.Pt receptive to treatment and safety maintained on unit.

## 2016-03-30 NOTE — Plan of Care (Signed)
Problem: Alteration in mood & ability to function due to Goal: LTG-Pt reports reduction in suicidal thoughts (Patient reports reduction in suicidal thoughts and is able to verbalize a safety plan for whenever patient is feeling suicidal)  Outcome: Progressing Patient denies SI/HI.      

## 2016-03-30 NOTE — Progress Notes (Signed)
D: Pt denies SI/HI/AVH. Pt is pleasant and cooperative. Pt had minimal interaction on unit this evening, pt fowaeds little to this Clinical research associatewriter.  A: Pt was offered support and encouragement. Pt was given scheduled medications. Pt was encourage to attend groups. Q 15 minute checks were done for safety.   R: Pt is taking medication. Pt has no complaints.Pt receptive to treatment and safety maintained on unit.

## 2016-03-30 NOTE — Plan of Care (Signed)
Problem: Ineffective individual coping Goal: STG: Patient will remain free from self harm Outcome: Progressing Patient remains free from self harm currently on the unit

## 2016-03-30 NOTE — Plan of Care (Signed)
Problem: Ineffective individual coping Goal: LTG: Patient will report a decrease in negative feelings Outcome: Progressing Patient reports feeling better today than he did yesterday     

## 2016-03-30 NOTE — Plan of Care (Signed)
Problem: Ineffective individual coping Goal: STG: Patient will remain free from self harm Outcome: Progressing Pt safe on the unit at this time     

## 2016-03-30 NOTE — [facility] Group Notes (Signed)
[facility] LCSW Group Therapy  03/30/2016 2:57 PM  Type of Therapy:  Group Therapy  Participation Level:  Active  Participation Quality:  Attentive  Affect:  Appropriate  Cognitive:  Alert  Insight:  Limited  Engagement in Therapy:  Limited  Modes of Intervention:  Discussion, Education, Socialization and Support  Summary of Progress/Problems: Boundaries: Patients defined boundaries and discussed the importance of them. Patients identified their own boundaries and how they feel when they are crossed. Patients discussed ways to create and/ or improve their personal boundaries. Pt attended group and stayed the entire time. Pt sat quietly and listened to other group members share.   Yarissa Reining L Teresita Fanton MSW, LCSWA  03/30/2016, 2:57 PM  

## 2016-03-30 NOTE — Progress Notes (Signed)
Patient ID: Joshua [name], male   DOB: 09/21/1991, 25 y.o.   MRN: [id number] [facility] MD Progress Note  03/30/2016 10:28 AM Joshua [name]  MRN:  [id number]   Subjective:    Pt admitted for mood derangements and alcohol use disorder, severe.  Patient seen this morning. Reports doing better. Eating well and sleeping well. Denies any withdrawal symptoms. Tolerating his medications well. Denies any side effects. Pt denies cravings for alcohol. Pt denies symptoms of alcohol withdrawal.    He has goals and a plan to help stop alcohol and cannabis use as an outpatient. Pt is interested in attending [facility] for treatment after discharge.   Pt denies medication side effects. Pt currently denies SI, HI and AVH.   Principal Problem: <principal problem not specified> Diagnosis:   Patient Active Problem List   Diagnosis Date Noted  . Schizophrenia (HCC) [F20.9] 03/26/2016  . Alcohol abuse [F10.10] 03/26/2016  . Marijuana abuse [F12.10] 03/26/2016  . Alcohol use disorder, moderate, dependence (HCC) [F10.20]    Total Time spent with patient: 15 minutes   Past Medical History:  Past Medical History  Diagnosis Date  . Bipolar 1 disorder (HCC)   . Schizophrenia (HCC)    History reviewed. No pertinent past surgical history. Family History: History reviewed. No pertinent family history. Social History:  History  Alcohol Use  . Yes    Comment: pt states does not drink every day, but drank 2 40's today.     History  Drug Use  . Yes  . Special: Marijuana    Social History   Social History  . Marital Status: Single    Spouse Name: N/A  . Number of Children: N/A  . Years of Education: N/A   Social History Main Topics  . Smoking status: Current Every Day Smoker -- 1.00 packs/day for 5 years    Types: Cigarettes  . Smokeless tobacco: None  . Alcohol Use: Yes     Comment: pt states does not drink every day, but drank 2 40's today.  . Drug Use: Yes    Special: Marijuana  . Sexual  Activity: Not Asked   Other Topics Concern  . None   Social History Narrative   Additional History:    Sleep: Good  Appetite:  Good   Assessment:   Musculoskeletal: Strength & Muscle Tone: within normal limits Gait & Station: normal Patient leans: N/A   Psychiatric Specialty Exam: Physical Exam  ROS  Blood pressure 115/71, pulse 66, temperature 98.3 F (36.8 C), temperature source Oral, resp. rate 18, height 6' 0.64" (1.845 m), weight 137 lb (62.143 kg), SpO2 100 %.Body mass index is 18.26 kg/(m^2).  General Appearance: Casual  Eye Contact::  Good  Speech:  Clear and Coherent and Normal Rate  Volume:  Normal  Mood:  "I'm fine."  Affect:  Restricted  Thought Process:  Coherent, Goal Directed, Intact, Linear and Logical  Orientation:  Full (Time, Place, and Person)  Thought Content:  Negative  Suicidal Thoughts:  No  Homicidal Thoughts:  No  Memory:  Immediate;   Good Recent;   Good Remote;   Good  Judgement:  Fair  Insight:  Fair  Psychomotor Activity:  Normal  Concentration:  Good  Recall:  Good  Fund of Knowledge:Good  Language: Good  Cognition: WNL    Akathisia:  No             Current Medications: Current Facility-Administered Medications  Medication Dose Route Frequency Provider Last Rate Last Dose  .  acetaminophen (TYLENOL) tablet 650 mg  650 mg Oral Q6H PRN Audery AmelJohn T Clapacs, MD      . alum & mag hydroxide-simeth (MAALOX/MYLANTA) 200-200-20 MG/5ML suspension 30 mL  30 mL Oral Q4H PRN Audery AmelJohn T Clapacs, MD      . benztropine (COGENTIN) tablet 0.5 mg  0.5 mg Oral BID PRN Audery AmelJohn T Clapacs, MD      . chlordiazePOXIDE (LIBRIUM) capsule 25 mg  25 mg Oral Q6H PRN Gena Frayyan G McQueen, MD      . gabapentin (NEURONTIN) 250 MG/5ML solution 50 mg  50 mg Oral BID Gena Frayyan G McQueen, MD   50 mg at 03/30/16 0907  . gabapentin (NEURONTIN) capsule 100 mg  100 mg Oral QHS Gena Frayyan G McQueen, MD   100 mg at 03/29/16 2152  . haloperidol (HALDOL) tablet 2 mg  2 mg Oral BID Audery AmelJohn T  Clapacs, MD   2 mg at 03/30/16 [id number]  . hydrOXYzine (ATARAX/VISTARIL) tablet 25 mg  25 mg Oral Q6H PRN Gena Frayyan G McQueen, MD   25 mg at 03/29/16 2153  . loperamide (IMODIUM) capsule 2-4 mg  2-4 mg Oral PRN Gena Frayyan G McQueen, MD      . magnesium hydroxide (MILK OF MAGNESIA) suspension 30 mL  30 mL Oral Daily PRN Audery AmelJohn T Clapacs, MD      . multivitamin with minerals tablet 1 tablet  1 tablet Oral Daily Gena Frayyan G McQueen, MD   1 tablet at 03/30/16 [id number]  . nicotine (NICODERM CQ - dosed in mg/24 hours) patch 21 mg  21 mg Transdermal Daily Audery AmelJohn T Clapacs, MD   21 mg at 03/30/16 0910  . ondansetron (ZOFRAN-ODT) disintegrating tablet 4 mg  4 mg Oral Q6H PRN Gena Frayyan G McQueen, MD      . thiamine (VITAMIN B-1) tablet 100 mg  100 mg Oral Daily Gena Frayyan G McQueen, MD   100 mg at 03/30/16 [id number]    Lab Results:  No results found for this or any previous visit (from the past 48 hour(s)).  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 MDD, recurrent, severe and unspecified anxiety disorder: - pt does not appear to have a h/o manic episodes.   - Consider starting fluoxetine 20mg  po Qam due to repeated h/o depression.   3. Alcohol use disorder, severe - Start CIWA protocol - If gabapentin solution is not available, will change to gabapentin capsule 100mg  po BID.   4. Cannabis use disorder, moderate - Continue to monitor - Will treat symptomatic occurences.   5. Diet: regular  6. Observation: 15 minute checks  7. Precautions: Seizure  Medical Decision Making:  Established Problem,  Stable/Improving (1), Review of Psycho-Social Stressors (1), Review or order clinical lab tests (1), Review of Medication Regimen & Side Effects (2) and Review of New Medication or Change in Dosage (2)   Breawna Montenegro 03/30/2016, 10:28 AM

## 2016-03-30 NOTE — Progress Notes (Signed)

## 2016-03-31 MED ORDER — LORATADINE 10 MG PO TABS
10.0000 mg | ORAL_TABLET | Freq: Every day | ORAL | Status: DC
  Administered 2016-03-31 – 2016-04-01 (×2): 10 mg via ORAL
  Filled 2016-03-31 (×2): qty 1

## 2016-03-31 NOTE — [facility] Group Notes (Signed)
[facility] LCSW Group Therapy  03/31/2016 11:04 AM  Type of Therapy:  Group Therapy  Participation Level:  Active  Participation Quality:  Attentive  Affect:  Appropriate  Cognitive:  Alert  Insight:  Limited  Engagement in Therapy:  Limited  Modes of Intervention:  Discussion, Education, Socialization and Support  Summary of Progress/Problems: Mindfulness: Patient discussed mindfulness and relaxing techniques and why they are beneficial. Pt discussed ways to incorporate mindfulness in their lives. Pt practiced a mindfulness techique and discussed how it made them feel. Pt attended group and stayed the entire time. Pt participated in activity well.   Ishi Danser L Samier Jaco MSW, LCSWA  03/31/2016, 11:04 AM   

## 2016-03-31 NOTE — Progress Notes (Signed)
D:  Per pt self inventory pt reports sleeping good, appetite good, energy level normal, ability to pay attention good, rates depression at a 0 out of 10, hopelessness at a 0 out of 10, anxiety at a 0 out of 10, denies SI/HI/AVH, goal today: "going to the groups and taking meds to go home", flat during interaction but pleasant, states that he needs allergy medication ordered, Dr. Daleen Boavi notified.     A:  Emotional support provided, Encouraged pt to continue with treatment plan and attend all group activities, q15 min checks maintained for safety.  R:  Pt is receptive, going to groups, pleasant and cooperative with staff and other patients on the unit.

## 2016-03-31 NOTE — Progress Notes (Signed)
Patient ID: Joshua [name], male   DOB: 1991/01/05, 25 y.o.   MRN: [id number]  [facility] Progress Note  03/31/2016 1:06 PM Joshua [name]  MRN:  [id number]   Subjective:    Pt admitted for mood derangements and alcohol use disorder, severe.  Patient seen this morning. Reports doing well. Eating well and sleeping well. Per staff he has been cooperative on the unit. He's been attending groups and participating appropriately. Denies any withdrawal symptoms. Tolerating his medications well. Denies any side effects. Pt denies cravings for alcohol. Pt denies symptoms of alcohol withdrawal. Vital signs stable pulse low at 55. Will continue to monitor.   He has goals and a plan to help stop alcohol and cannabis use as an outpatient. Pt is interested in attending [facility] for treatment after discharge.   Pt denies medication side effects. Pt currently denies SI, HI and AVH.   Principal Problem: <principal problem not specified> Diagnosis:   Patient Active Problem List   Diagnosis Date Noted  . Schizophrenia (HCC) [F20.9] 03/26/2016  . Alcohol abuse [F10.10] 03/26/2016  . Marijuana abuse [F12.10] 03/26/2016  . Alcohol use disorder, moderate, dependence (HCC) [F10.20]    Total Time spent with patient: 15 minutes   Past Medical History:  Past Medical History  Diagnosis Date  . Bipolar 1 disorder (HCC)   . Schizophrenia (HCC)    History reviewed. No pertinent past surgical history. Family History: History reviewed. No pertinent family history. Social History:  History  Alcohol Use  . Yes    Comment: pt states does not drink every day, but drank 2 40's today.     History  Drug Use  . Yes  . Special: Marijuana    Social History   Social History  . Marital Status: Single    Spouse Name: N/A  . Number of Children: N/A  . Years of Education: N/A   Social History Main Topics  . Smoking status: Current Every Day Smoker -- 1.00 packs/day for 5 years    Types: Cigarettes  . Smokeless  tobacco: None  . Alcohol Use: Yes     Comment: pt states does not drink every day, but drank 2 40's today.  . Drug Use: Yes    Special: Marijuana  . Sexual Activity: Not Asked   Other Topics Concern  . None   Social History Narrative   Additional History:    Sleep: Good  Appetite:  Good   Assessment:   Musculoskeletal: Strength & Muscle Tone: within normal limits Gait & Station: normal Patient leans: N/A   Psychiatric Specialty Exam: Physical Exam  ROS  Blood pressure 117/72, pulse 55, temperature 97.9 F (36.6 C), temperature source Oral, resp. rate 18, height 6' 0.64" (1.845 m), weight 137 lb (62.143 kg), SpO2 100 %.Body mass index is 18.26 kg/(m^2).  General Appearance: Casual  Eye Contact::  Good  Speech:  Clear and Coherent and Normal Rate  Volume:  Normal  Mood:  "I'm fine."  Affect:  More animated today   Thought Process:  Coherent, Goal Directed, Intact, Linear and Logical  Orientation:  Full (Time, Place, and Person)  Thought Content:  Negative  Suicidal Thoughts:  No  Homicidal Thoughts:  No  Memory:  Immediate;   Good Recent;   Good Remote;   Good  Judgement:  Fair  Insight:  Fair  Psychomotor Activity:  Normal  Concentration:  Good  Recall:  Good  Fund of Knowledge:Good  Language: Good  Cognition: WNL    Akathisia:  No             Current Medications: Current Facility-Administered Medications  Medication Dose Route Frequency Provider Last Rate Last Dose  . acetaminophen (TYLENOL) tablet 650 mg  650 mg Oral Q6H PRN Audery Amel, MD      . alum & mag hydroxide-simeth (MAALOX/MYLANTA) 200-200-20 MG/5ML suspension 30 mL  30 mL Oral Q4H PRN Audery Amel, MD      . benztropine (COGENTIN) tablet 0.5 mg  0.5 mg Oral BID PRN Audery Amel, MD      . gabapentin (NEURONTIN) 250 MG/5ML solution 50 mg  50 mg Oral BID Gena Fray, MD   50 mg at 03/31/16 0840  . gabapentin (NEURONTIN) capsule 100 mg  100 mg Oral QHS Gena Fray, MD   100  mg at 03/30/16 2133  . haloperidol (HALDOL) tablet 2 mg  2 mg Oral BID Audery Amel, MD   2 mg at 03/31/16 0840  . loratadine (CLARITIN) tablet 10 mg  10 mg Oral Daily Zaryah Seckel, MD   10 mg at 03/31/16 1235  . magnesium hydroxide (MILK OF MAGNESIA) suspension 30 mL  30 mL Oral Daily PRN Audery Amel, MD      . multivitamin with minerals tablet 1 tablet  1 tablet Oral Daily Gena Fray, MD   1 tablet at 03/31/16 0840  . nicotine (NICODERM CQ - dosed in mg/24 hours) patch 21 mg  21 mg Transdermal Daily Audery Amel, MD   21 mg at 03/31/16 0840  . thiamine (VITAMIN B-1) tablet 100 mg  100 mg Oral Daily Gena Fray, MD   100 mg at 03/31/16 0840    Lab Results:  No results found for this or any previous visit (from the past 48 hour(s)).  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 MDD, recurrent, severe and unspecified anxiety disorder: - pt does not appear to have a h/o manic episodes.   - Consider starting fluoxetine  po Qam due to repeated h/o depression.   3. Alcohol use disorder, severe - CIWA protocol - If gabapentin solution is not available, will change to gabapentin capsule  po BID.   4. Cannabis use disorder, moderate - Continue to monitor - Will treat symptomatic occurences.   5. Diet: regular  6. Observation: 15 minute checks  7. Precautions: Seizure  Medical Decision Making:  Established Problem, Stable/Improving (1), Review of Psycho-Social Stressors (1), Review or order clinical lab tests (1), Review of  Medication Regimen & Side Effects (2) and Review of New Medication or Change in Dosage (2)   Ayza Ripoll 03/31/2016, 1:06 PM

## 2016-04-01 ENCOUNTER — Encounter: Payer: Self-pay | Admitting: Psychiatry

## 2016-04-01 DIAGNOSIS — F209 Schizophrenia, unspecified: Principal | ICD-10-CM

## 2016-04-01 MED ORDER — BENZTROPINE MESYLATE 1 MG PO TABS: 1.0000 mg | ORAL_TABLET | Freq: Every day | ORAL | Status: DC

## 2016-04-01 MED ORDER — HALOPERIDOL 5 MG PO TABS: 5.0000 mg | ORAL_TABLET | Freq: Every day | ORAL | Status: DC

## 2016-04-01 NOTE — Progress Notes (Signed)
D/C instructions/transition record/suicide risk assessmenr/meds/follow-up appointments reviewed, pt verbalized understanding, pt's belongings returned to pt, denies SI/HI/AVH, 7 day supply given.

## 2016-04-01 NOTE — Tx Team (Signed)
Interdisciplinary Treatment Plan Update (Adult)        Date: 04/01/2016   Time Reviewed: 9:30 AM   Progress in Treatment: Improving  Attending groups: Yes Participating in groups: Yes Taking medication as prescribed: Yes  Tolerating medication: Yes  Family/Significant other contact made: Pt refused family contact Patient understands diagnosis: Yes  Discussing patient identified problems/goals with staff: Yes  Medical problems stabilized or resolved: Yes  Denies suicidal/homicidal ideation: Yes  Issues/concerns per patient self-inventory: Yes  Other:   New problem(s) identified: N/A   Discharge Plan or Barriers: Pt plans to discharge home to live with his mother and follow up with [facility] for medication management and therapy  Reason for Continuation of Hospitalization:   Depression   Anxiety   Medication Stabilization   Comments: N/A   Estimated date of discharge: 04/01/16    Pt is a 25 y.o. male patient admitted with "I know I'm going to hurt someone".  HPI: Patient interviewed. Chart reviewed. Labs and vitals reviewed. 25 year old man was brought in after family reported he became agitated at home. Patient appears to have pretty good insight about the situation. He says he's been drinking more and more recently. Several times a week he drinks 2 or more of the 40 ounce size beers. He says when he drinks he starts to black out and gets crazy. He will get paranoid and start seeing and hearing things. Yesterday he escalated into agitation over something trivial at home and wound up throwing a mug that broke a piece of a kitchen appliance. Patient denies he's having any thoughts of hurting himself and denies that he wants to hurt anyone else. He says he also uses marijuana regularly. When he is not using he still occasionally has auditory hallucinations. He is not on any psychiatric medicine currently. Minimal support not getting any outpatient treatment. Doesn't do much during the day.  Sleeps poorly at night.  Medical history: No significant known medical problems outside of the alcohol and drug abuse.  Social history: Patient lives with his mother and 3 younger sisters. He is not currently working. Says that he used to work at a [employer] but it was way too stressful for him. Sounds like he has never really functioned well since high school. Has lived in [place] and [place] in the past which is where he got prior psychiatric treatment. Only been here in [place] about a year.  Substance abuse history: Long history of alcohol abuse and marijuana abuse. Denies abuse of other drugs. No histories of DTs or seizures. He knows he has blackouts. He's never been in substance abuse treatment but has been able to stop for up to a month on his own.  Past Psychiatric History: He has had hospitalizations in [place]. Sounds like he's been told he had either schizophrenia or bipolar disorder in the past and was on medicine years ago but hasn't taken any since then. Can't remember exactly what it was. He does have a past history of suicide attempts in the past in fact even tried killing himself by drinking bleach within the last couple months. Denies that he is ever seriously been violent anyone else.  Patient will benefit from crisis stabilization, medication evaluation, group therapy, and psycho education in addition to case management for discharge planning. Patient and CSW reviewed pt's identified goals and treatment plan. Pt verbalized understanding and agreed to treatment plan.    Review of initial/current patient goals per problem list:  1. Goal(s): Patient will participate  in aftercare plan   Met: Yes  Target date: 3-5 days post admission date   As evidenced by: Patient will participate within aftercare plan AEB aftercare provider and housing plan at discharge being identified.   4/12: Pt plans to discharge home to live with his mother and follow up with [facility] for medication  management and therapy   2. Goal (s): Patient will exhibit decreased depressive symptoms and suicidal ideations.   Met: No  Target date: 3-5 days post admission date   As evidenced by: Patient will utilize self-rating of depression at 3 or below and demonstrate decreased signs of depression or be deemed stable for discharge by MD.   4/12: Goal progressing.  Pt denies SI/HI.  4/14: Goal progressing.  Pt denies SI/HI.    4/17: Adequate for discharge per MD.    3. Goal(s): Patient will demonstrate decreased signs and symptoms of anxiety.   Met: Adequate for discharge per MD.  Target date: 3-5 days post admission date   As evidenced by: Patient will utilize self-rating of anxiety at 3 or below and demonstrated decreased signs of anxiety, or be deemed stable for discharge by MD   4/12: Goal progressing.  4/14: Goal progressing.  4/17: Adequate for discharge per MD.    4. Goal(s): Patient will demonstrate decreased signs of withdrawal due to substance abuse   Met: Yes  Target date: 3-5 days post admission date   As evidenced by: Patient will produce a CIWA/COWS score of 0, have stable vitals signs, and no symptoms of withdrawal   4/12: Patient produced a CIWA/COWS score of 0, has stable vitals signs, and no symptoms of withdrawal     5. Goal(s): Patient will demonstrate decreased signs of psychosis  * Met: Adequate for discharge per MD. * Target date: 3-5 days post admission date  * As evidenced by: Patient will demonstrate decreased frequency of AVH or return to baseline function   4/12: Goal progressing.  4/14: Goal progressing.  Pt reports on occasion he hears voices.  4/17: Adequate for discharge per MD.    Attendees:  Patient:  Family:  Physician: Dr. Jerilee Hoh, MD    04/01/2016 9:30 AM  Nursing: Polly Cobia, RN     04/01/2016 9:30 AM  Clinical Social Worker: Marylou Flesher, [place]  04/01/2016 9:30 AM  Nursing: Lucile Shutters     04/01/2016 9:30 AM  Recreational  Therapist: Everitt Amber   04/01/2016 9:30 AM  Nursing: Terressa Koyanagi, RN    04/01/2016 9:30 AM  Other:         04/01/2016 9:30 AM   Marylou Flesher [place], LCAS  04/01/16

## 2016-04-01 NOTE — Plan of Care (Signed)
Problem: Ineffective individual coping Goal: STG:Pt. will utilize relaxation techniques to reduce stress STG: Patient will utilize relaxation techniques to reduce stress levels  Outcome: Progressing Pt agrees to utilized learned relaxation techniques to reduce stress levels.

## 2016-04-01 NOTE — Progress Notes (Signed)
Pt is alert and oriented this shift, cooperative and compliant; attended and participated in groups. Denies any SI/HI, no concerns voiced; will continue to monitor for safety.

## 2016-04-01 NOTE — Progress Notes (Addendum)
  [facility] :  Will you be returning to the same living situation after discharge:  Yes,  [place] with [place] mother At discharge, do you have transportation home?: Yes,  pt will be picked upby his mother Do you have the ability to pay for your medications: Yes,  pt will be provided with prescriptions at discharge  Release of information consent forms completed and in the chart;  Patient's signature needed at discharge.  Patient to Follow up at: Follow-up Information    Follow up with [facility].   Why:  Please arrive to the walk-in clinic between the hours of 8am-2:30pm for an assessment for substance abuse treatment, medication management and therapy. Please call Unk PintoHarvey Bryant at [phone] upon discharge for questions and assistance.   Contact information:   [address] [place] [address] Ph: [phone] Fax: [phone]      Next level of care provider [facility] Link:no  Safety Planning and Suicide Prevention discussed: Yes,  completed with pt  Have you used any form of tobacco in the last 30 days? (Cigarettes, Smokeless Tobacco, Cigars, and/or Pipes): Yes  Has patient been referred to the Quitline?: Yes, faxed on 03/27/16  Patient has been referred for addiction treatment: Yes  Dorothe PeaJonathan F Jebadiah Imperato 04/01/2016, 1:35 PM
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
## 2016-04-01 NOTE — Progress Notes (Signed)
Recreation Therapy Notes  Date: 04.17.17 Time: 1:00 pm Location: Craft Room  Group Topic: Self-expression  Goal Area(s) Addresses:  Patient will be able to identify a color that represents each emotion. Patient will verbalize benefit of using art as a means of self-expression. Patient will verbalize one emotion experienced while participating in activity.  Behavioral Response: Attentive, Interactive  Intervention: The Colors Within Me  Activity: Patients were given a blank face worksheet and instructed to pick a color for each emotion they were experiencing and to show on the face how much of that emotion they are experiencing.  Education: LRT educated patients on other forms of self-expression.  Education Outcome: Acknowledges education/In group clarification offered  Clinical Observations/Feedback: Patient completed activity by picking a color for each emotion he was experiencing and showing on the face worksheet how much of that emotion he was experiencing. Patient contributed to group discussion by stating some of the emotions he was experiencing, how his emotions affect his treatment in the hospital, he thought his emotions were dynamic, some things that can make his emotions change, that it was helpful to see his emotions on paper and why, and what emotion he felt while he was coloring.  Jacquelynn CreeGreene,Marielle Mantione M, LRT/CTRS 04/01/2016 2:32 PM

## 2016-04-01 NOTE — [facility] Suicide Risk Assessment (Signed)
[facility]   Principal Problem: schizophrenia  Discharge Diagnoses:  Patient Active Problem List   Diagnosis Date Noted  . Schizophrenia (HCC) [F20.9] 03/26/2016  . Alcohol abuse [F10.10] 03/26/2016  . Marijuana abuse [F12.10] 03/26/2016  . Alcohol use disorder, moderate, dependence (HCC) [F10.20]       Psychiatric Specialty Exam: ROS  Blood pressure 113/63, pulse 59, temperature 98.6 F (37 C), temperature source Oral, resp. rate 20, height 6' 0.64" (1.845 m), weight 62.143 kg (137 lb), SpO2 100 %.Body mass index is 18.26 kg/(m^2).                                                       Mental Status Per Nursing Assessment::   On Admission:  NA  Demographic Factors:  Male  Loss Factors: Financial problems/change in socioeconomic status  Historical Factors: NA  Risk Reduction Factors:   Positive social support  Continued Clinical Symptoms:  Schizophrenia:   Less than 25 years old  Cognitive Features That Contribute To Risk:  None    Suicide Risk:  Minimal: No identifiable suicidal ideation.  Patients presenting with no risk factors but with morbid ruminations; may be classified as minimal risk based on the severity of the depressive symptoms  Follow-up Information    [facility].   Why:  Please arrive to the walk-in clinic between the hours of 8am-2:30pm for an assessment for medication management and therapy.  Arrive as early as possible for prompt service.  Please call Unk PintoHarvey Bryant at [phone] for questions and assistance.   Contact information:   [address] [place] [address] Ph: [phone] Fax: [phone]      Jimmy FootmanHernandez-Gonzalez,  Reni Hausner, MD 04/01/2016, 10:51 AM

## 2016-04-10 ENCOUNTER — Ambulatory Visit: Payer: Medicaid Other

## 2016-05-04 ENCOUNTER — Emergency Department
Admission: EM | Admit: 2016-05-04 | Discharge: 2016-05-05 | Disposition: A | Discharge status: Home / Self Care | Payer: Self-pay | Attending: Student | Admitting: Student

## 2016-05-04 ENCOUNTER — Encounter: Payer: Self-pay | Admitting: Emergency Medicine

## 2016-05-04 DIAGNOSIS — T50904A Poisoning by unspecified drugs, medicaments and biological substances, undetermined, initial encounter: Secondary | ICD-10-CM

## 2016-05-04 DIAGNOSIS — T428X1A Poisoning by antiparkinsonism drugs and other central muscle-tone depressants, accidental (unintentional), initial encounter: Secondary | ICD-10-CM | POA: Insufficient documentation

## 2016-05-04 DIAGNOSIS — F209 Schizophrenia, unspecified: Secondary | ICD-10-CM | POA: Insufficient documentation

## 2016-05-04 DIAGNOSIS — F17210 Nicotine dependence, cigarettes, uncomplicated: Secondary | ICD-10-CM | POA: Insufficient documentation

## 2016-05-04 LAB — SALICYLATE LEVEL

## 2016-05-04 LAB — CBC
HEMATOCRIT: 43.1 % (ref 40.0–52.0)
Hemoglobin: 14.4 g/dL (ref 13.0–18.0)
MCH: 28.5 pg (ref 26.0–34.0)
MCHC: 33.4 g/dL (ref 32.0–36.0)
MCV: 85.5 fL (ref 80.0–100.0)
PLATELETS: 278 10*3/uL (ref 150–440)
RBC: 5.04 MIL/uL (ref 4.40–5.90)
RDW: 13.3 % (ref 11.5–14.5)
WBC: 9 10*3/uL (ref 3.8–10.6)

## 2016-05-04 LAB — COMPREHENSIVE METABOLIC PANEL
ALK PHOS: 69 U/L (ref 38–126)
ALT: 17 U/L (ref 17–63)
AST: 23 U/L (ref 15–41)
Albumin: 4.6 g/dL (ref 3.5–5.0)
Anion gap: 8 (ref 5–15)
BILIRUBIN TOTAL: 0.3 mg/dL (ref 0.3–1.2)
BUN: 9 mg/dL (ref 6–20)
CALCIUM: 9.3 mg/dL (ref 8.9–10.3)
CHLORIDE: 111 mmol/L (ref 101–111)
CO2: 23 mmol/L (ref 22–32)
CREATININE: 0.89 mg/dL (ref 0.61–1.24)
Glucose, Bld: 95 mg/dL (ref 65–99)
Potassium: 3.6 mmol/L (ref 3.5–5.1)
Sodium: 142 mmol/L (ref 135–145)
Total Protein: 7.9 g/dL (ref 6.5–8.1)

## 2016-05-04 LAB — URINE DRUG SCREEN, QUALITATIVE (ARMC ONLY)
AMPHETAMINES, UR SCREEN: NOT DETECTED
BENZODIAZEPINE, UR SCRN: NOT DETECTED
Barbiturates, Ur Screen: NOT DETECTED
Cannabinoid 50 Ng, Ur ~~LOC~~: POSITIVE — AB
Cocaine Metabolite,Ur ~~LOC~~: NOT DETECTED
MDMA (ECSTASY) UR SCREEN: NOT DETECTED
METHADONE SCREEN, URINE: NOT DETECTED
Opiate, Ur Screen: NOT DETECTED
PHENCYCLIDINE (PCP) UR S: NOT DETECTED
Tricyclic, Ur Screen: NOT DETECTED

## 2016-05-04 LAB — ETHANOL: ALCOHOL ETHYL (B): 239 mg/dL — AB (ref <5)

## 2016-05-04 LAB — ACETAMINOPHEN LEVEL: Acetaminophen (Tylenol), Serum: <10 ug/mL — ABNORMAL LOW (ref 10–30)

## 2016-05-04 MED ORDER — SODIUM CHLORIDE 0.9 % IV BOLUS (SEPSIS)
1000.0000 mL | Freq: Once | INTRAVENOUS | Status: AC
  Administered 2016-05-04: 1000 mL via INTRAVENOUS

## 2016-05-04 NOTE — BH Assessment (Signed)
Assessment received. Contacted [facility] to request tele-machine be placed in patients room. Assessment to commence shortly.   Davina PokeJoVea Chidinma Clites, LCSW Therapeutic Triage Specialist [facility] 05/04/2016 10:41 PM

## 2016-05-04 NOTE — BH Assessment (Signed)
[facility] with [facility] answer.   Davina PokeJoVea Coran Dipaola, LCSW Therapeutic Triage Specialist [facility] 05/04/2016 10:51 PM
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
## 2016-05-04 NOTE — BH Assessment (Signed)
Connected to patient via tele-machine. Patient appeared drowsy. Patient was able to provide his demographic information and fell asleep. This writer attempted to call the patients name x4 and patient did not respond. Contacted patients nurse to request the consult be removed and placed again once patient is alert and prepared to participate in the assessment. Patients nurse is in agreement  Davina PokeJoVea Kriya Westra, LCSW Therapeutic Triage Specialist [facility] 05/04/2016 11:10 PM'

## 2016-05-04 NOTE — ED Notes (Signed)
Pt sleeping, resps unlabored.  

## 2016-05-05 LAB — ACETAMINOPHEN LEVEL: Acetaminophen (Tylenol), Serum: <10 ug/mL — ABNORMAL LOW (ref 10–30)

## 2016-05-05 NOTE — ED Notes (Signed)
Disposition discussed with patient. Waiting for discharge paperwork from MD.  

## 2016-05-05 NOTE — ED Notes (Signed)
Report to felicia, rn.  

## 2016-05-05 NOTE — ED Notes (Signed)
Patient asleep in room. No noted distress or abnormal behavior. Will continue 15 minute checks and observation by security cameras for safety. 

## 2016-05-05 NOTE — ED Notes (Signed)
Patient discharged ambulatory to self. He denies SI or HI. Denies hallucinations. Discharge instructions reviewed with patient, he verbalizes understanding. Patient received all personal belongings and copy of DC plan.

## 2016-05-05 NOTE — ED Notes (Signed)
Error/Wrong chart - EMTALA and Medical necessity documented on wrong patient.

## 2016-05-05 NOTE — ED Notes (Signed)
Patient resting quietly in room. No noted distress or abnormal behaviors noted. Will continue 15 minute checks and observation by security camera for safety. 

## 2016-05-05 NOTE — BH Assessment (Signed)
[facility] who reports that patient is still not ready for assessment due to intoxication. Requested that the consult be removed and placed back once patient is alert and able to participate in the assessment.   Davina PokeJoVea Derra Shartzer, LCSW Therapeutic Triage Specialist [facility] 05/05/2016 3:40 AM

## 2016-05-05 NOTE — BH Assessment (Signed)
Discussed patient with ER MD (Dr. Williams) and patient is able to discharge home when medically cleared. Patient was giving referral information and instructions on how to follow up with Outpatient Treatment ([facility]) and Mobile Crisis.  Patient denies SI/HI and AV/H. 

## 2016-05-05 NOTE — BH Assessment (Signed)
Writer spoke with the patient to assess his current mental and emotional state. Patient reports of having no SI/HI and AV/H. Admits to when he drinks alcohol and or smoke Cannabis his behaviors become unpredictable. Thus, he was brought to the ER.  Patient acknowledges he need to stop abusing alcohol and cannabis.

## 2016-05-05 NOTE — BH Assessment (Signed)
Tele Assessment Note   Joshua [name] is an 25 y.o. male who presents unaccompanied to [facility] ED with law enforcement after being intoxicated and disruptive at his mother's home and taking an overdose of medication. Pt has a history of schizophrenia and alcohol abuse. Pt was too drowsy to participate in assessment most of the night but is now alert. Pt says he was arguing with his mother last night because she was upset with him for drinking alcohol. Pt says he drank approximately 40 ounces of beer. He says he is under stress due to losing his job and having financial problems. Pt reports he took approximately six tabs of 5 mg Haldol and twelve tabs of benztropine "because I was frustrated and wanted to calm down." Pt denies this was a suicide attempt. Pt acknowledges he has attempted suicide in the past. Pt's mother reported to [employer] that Pt said he was a demon and left the house carrying a kitchen knife. Pt says he doesn't remember that.   Pt denies current suicidal ideation. He denies current homicidal ideation or any history of violence. He denies current auditory or visual hallucinations. He reports drinking approximately 80 ounces of beer 3-4 times per week. He denies any recent use of marijuana but his urine drug screen is positive for cannabis. Pt's blood alcohol upon arrival was 239. Pt says he has a psychiatrist but does not remember his name and does not know when his next appointment is scheduled. Pt has a history of inpatient psychiatric treatment and was last hospitalized at [facility] in April 2017.  Pt is dressed in hospital scrubs, alert, oriented x4 with normal speech and normal motor behavior. Eye contact is good. Pt's mood is anxious and affect is congruent with mood. Thought process is coherent and relevant. There is no indication Pt is currently responding to internal stimuli or experiencing delusional thought content. Pt says he doesn't want to be admitted to a psychiatric hospital  and is requesting to be discharged to his current outpatient provider.   Diagnosis: Schizophrenia, Alcohol Use Disorder, Moderate; Cannabis Use Disorder, Mild  Past Medical History:  Past Medical History  Diagnosis Date  . Bipolar 1 disorder (HCC)   . Schizophrenia (HCC)     History reviewed. No pertinent past surgical history.  Family History: History reviewed. No pertinent family history.  Social History:  reports that he has been smoking Cigarettes.  He has a 5 pack-year smoking history. He does not have any smokeless tobacco history on file. He reports that he drinks alcohol. He reports that he uses illicit drugs (Marijuana).  Additional Social History:  Alcohol / Drug Use Pain Medications: See PTA Prescriptions: See PTA Over the Counter: See PTA History of alcohol / drug use?: Yes Longest period of sobriety (when/how long): Two months Negative Consequences of Use: Financial, Legal, Personal relationships, Work / School Substance #1 Name of Substance 1: Marijuana 1 - Age of First Use: 17 1 - Amount (size/oz): 1 blunt 1 - Frequency: every 2 weeks 1 - Duration: Ongoing 1 - Last Use / Amount: Unknown Substance #2 Name of Substance 2: Alcohol 2 - Age of First Use: 14 2 - Amount (size/oz): 2-40 oz beers or more 2 - Frequency: 3-4 days a week 2 - Duration: Several years 2 - Last Use / Amount: 05/04/16, Approximately 48 oz of beer  CIWA: CIWA-Ar BP: 96/63 mmHg Pulse Rate: (!) 59 COWS:    PATIENT STRENGTHS: (choose at least two) Ability for insight Average or  above average intelligence Capable of independent living Communication skills Physical Health  Allergies: No Known Allergies  Home Medications:  (Not in a hospital admission)  OB/GYN Status:  No LMP for male patient.  General Assessment Data Location of Assessment: [facility] ED TTS Assessment: In system Is this a Tele or Face-to-Face Assessment?: Tele Assessment Is this an Initial Assessment or a  Re-assessment for this encounter?: Initial Assessment Marital status: Single Maiden name: NA Is patient pregnant?: No Pregnancy Status: No Living Arrangements: Parent, Other relatives (Mother, two sisters) Can pt return to current living arrangement?: Yes Admission Status: Involuntary Is patient capable of signing voluntary admission?: Yes Referral Source: Self/Family/Friend Insurance type: Self-pay     Crisis Care Plan Living Arrangements: Parent, Other relatives (Mother, two sisters) Legal Guardian: Other: (None) Name of Psychiatrist: Pt cannot remember name Name of Therapist: None  Education Status Is patient currently in school?: No Current Grade: NA Highest grade of school patient has completed: 11th Name of school: [employer] person: n/a  Risk to self with the past 6 months Suicidal Ideation: No Has patient been a risk to self within the past 6 months prior to admission? : Yes Suicidal Intent: No Has patient had any suicidal intent within the past 6 months prior to admission? : No Is patient at risk for suicide?: No Suicidal Plan?: No Has patient had any suicidal plan within the past 6 months prior to admission? : No Access to Means: No What has been your use of drugs/alcohol within the last 12 months?: Pt using alcohol and marijuana Previous Attempts/Gestures: Yes How many times?: 8 Other Self Harm Risks: Pt impulsively overdosed on medication Triggers for Past Attempts: Unknown Intentional Self Injurious Behavior: None Family Suicide History: No Recent stressful life event(s): Job Loss, Financial Problems, Conflict (Comment) (Family conflict) Persecutory voices/beliefs?: No Depression: No Depression Symptoms: Feeling angry/irritable Substance abuse history and/or treatment for substance abuse?: Yes Suicide prevention information given to non-admitted patients: Not applicable  Risk to Others within the past 6 months Homicidal Ideation: No Does patient  have any lifetime risk of violence toward others beyond the six months prior to admission? : No Thoughts of Harm to Others: No Current Homicidal Intent: No Current Homicidal Plan: No Access to Homicidal Means: No Identified Victim: None History of harm to others?: No Assessment of Violence: None Noted Violent Behavior Description: Pt denies history of violence Does patient have access to weapons?: Yes (Comment) (Pt reported to be carrying kitchen knives) Criminal Charges Pending?: No Does patient have a court date: No Is patient on probation?: No  Psychosis Hallucinations: None noted Delusions: None noted  Mental Status Report Appearance/Hygiene: In scrubs Eye Contact: Good Motor Activity: Unremarkable Speech: Logical/coherent Level of Consciousness: Alert Mood: Anxious Affect: Appropriate to circumstance Anxiety Level: Minimal Thought Processes: Coherent, Relevant Judgement: Partial Orientation: Person, Place, Situation, Time Obsessive Compulsive Thoughts/Behaviors: None  Cognitive Functioning Concentration: Fair Memory: Recent Intact, Remote Intact IQ: Average Insight: Fair Impulse Control: Poor Appetite: Good Weight Loss: 0 Weight Gain: 0 Sleep: No Change Total Hours of Sleep: 7 Vegetative Symptoms: None  ADLScreening [facility] Assessment Services) Patient's cognitive ability adequate to safely complete daily activities?: Yes Patient able to express need for assistance with ADLs?: Yes Independently performs ADLs?: Yes (appropriate for developmental age)  Prior Inpatient Therapy Prior Inpatient Therapy: Yes Prior Therapy Dates: 2017, multiple admits Prior Therapy Facilty/Provider(s): [facility], Educational psychologist ([place]) Reason for Treatment: Bipolar Disorder, Paranoid Schizophrenia  Prior Outpatient Therapy Prior Outpatient Therapy: No Prior Therapy Dates: n/a  Prior Therapy Facilty/Provider(s): n/a Reason for Treatment: n/a Does patient have an ACCT team?: No Does  patient have Intensive In-House Services?  : No Does patient have [facility] services? : No Does patient have P4CC services?: No  ADL Screening (condition at time of admission) Patient's cognitive ability adequate to safely complete daily activities?: Yes Is the patient deaf or have difficulty hearing?: No Does the patient have difficulty seeing, even when wearing glasses/contacts?: No Does the patient have difficulty concentrating, remembering, or making decisions?: No Patient able to express need for assistance with ADLs?: Yes Does the patient have difficulty dressing or bathing?: No Independently performs ADLs?: Yes (appropriate for developmental age) Does the patient have difficulty walking or climbing stairs?: No Weakness of Legs: None Weakness of Arms/Hands: None  Home Assistive Devices/Equipment Home Assistive Devices/Equipment: None    Abuse/Neglect Assessment (Assessment to be complete while patient is alone) Physical Abuse: Denies Verbal Abuse: Denies Sexual Abuse: Denies Exploitation of patient/patient's resources: Denies Self-Neglect: Denies     Merchant navy officerAdvance Directives (For Healthcare) Does patient have an advance directive?: No Would patient like information on creating an advanced directive?: No - patient declined information    Additional Information 1:1 In Past 12 Months?: No CIRT Risk: No Elopement Risk: No Does patient have medical clearance?: Yes     Disposition: Binnie RailJoann Glover[facility], confirms adult unit [facility] at [facility]. [facility] also [facility]. Gave clinical report to Maryjean Mornharles Kober, PA who recommended Pt be evaluated by psychiatry this morning. Notified Dr. Loleta Roseory Forbach and April B Brumgard, RN of recommendation.  Disposition Initial Assessment Completed for this Encounter: Yes Disposition of Patient: Other dispositions Other disposition(s): Other (Comment)   Pamalee LeydenFord Ellis Dashanna Kinnamon Jr, [facility], [facility], [facility] Triage Specialist [phone]  [phone]   Patsy BaltimoreWarrick Jr, Harlin RainFord Ellis 05/05/2016 6:17 AM

## 2016-05-05 NOTE — ED Provider Notes (Signed)
-----------------------------------------   7:02 AM on 05/05/2016 -----------------------------------------   Blood pressure 89/56, pulse 62, temperature 97.8 F (36.6 C), temperature source Oral, resp. rate 18, height 6\' 2"  (1.88 m), weight 62.143 kg, SpO2 95 %.  The patient had no acute events since last update.  Calm and cooperative at this time.  Disposition is pending per psychiatry recommendations.   Loleta Roseory Doak Mah, MD 05/05/16 [phone]

## 2016-05-05 NOTE — Discharge Instructions (Signed)
Drug Overdose °Drug overdose happens when you take too much of a drug. An overdose can occur with illegal drugs, prescription drugs, or over-the-counter (OTC) drugs. °The effects of drug overdose can be mild, dangerous, or even deadly. °CAUSES °Drug overdose may be caused by: °· Taking too much of a drug on purpose. °· Taking too much of a drug by accident. °· An error made by a health care provider who prescribes a drug. °· An error made by a pharmacist who fills the prescription order. °Drugs that commonly cause overdose include: °· Mental health drugs. °· Pain medicines. °· Illegal drugs. °· OTC cough and cold medicines. °· Heart medicines. °· Seizure medicines. °RISK FACTORS °Drug overdose is more likely in: °· Children. They may be attracted to colorful pills. Because of children's small size, even a small amount of a drug can be dangerous. °· Elderly people. They may be taking many different drugs. Elderly people may have difficulty reading labels or remembering when they last took their medicine. °The risk of drug overdose is also higher for someone who: °· Takes illegal drugs. °· Takes a drug and drinks alcohol. °· Has a mental health condition. °SYMPTOMS °Signs and symptoms of drug overdose depend on the drug and the amount that was taken. Common danger signs include: °· Behavior changes. °· Sleepiness. °· Slowed breathing. °· Nausea and vomiting. °· Seizures. °· Changes in eye pupil size (very large or very small). °If there are signs of very low blood pressure from a drug overdose (shock), emergency treatment is required. These signs include: °· Cold and clammy skin. °· Pale skin. °· Blue lips. °· Very slow breathing. °· Extreme sleepiness. °· Loss of consciousness. °DIAGNOSIS °Drug overdose may be diagnosed based on your symptoms. It is important that you tell your health care provider: °· All of the drugs that you have taken. °· When you took the drugs. °· Whether you were drinking alcohol. °Your health  care provider will do a physical exam. This exam may include: °· Checking and monitoring your heart rate and rhythm, your temperature, and your blood pressure (vital signs). °· Checking your breathing and oxygen level. °You may also have tests, including:  °· Urine tests to check for drugs in your system. °· Blood tests to check for: °¨ Drugs in your system. °¨ Signs of an imbalance of your blood minerals (electrolytes). °¨ Liver damage. °¨ Kidney damage. °TREATMENT °Supporting your vital signs and your breathing is the first step in treating a drug overdose. Treatment may also include: °· Receiving fluids and electrolytes through an IV tube. °· Having a breathing tube (endotracheal tube) inserted in your airway to help you breathe. °· Having a tube passed through your nose and into your stomach (nasogastric tube) to wash out your stomach. °· Medicines. You may get medicines to: °¨ Make you vomit. °¨ Absorb any medicine that is left in your digestive system (activated charcoal). °¨ Block or reverse the effect of the drug that caused the overdose. °· Having your blood filtered through an artificial kidney machine (hemodialysis). You may need this if your overdose is severe or if you have kidney failure. °· Having ongoing counseling and mental health support if you intentionally overdosed or used an illegal drug. °HOME CARE INSTRUCTIONS °· Take medicines only as directed by your health care provider. Always ask your health care provider to discuss the possible side effects of any new drug that you start taking. °· Keep a list of all of the drugs   that you take, including over-the-counter medicines. Bring this list with you to all of your medical visits.  Read the drug inserts that come with your medicines.  Do not use illegal drugs.  Do not drink alcohol when taking drugs.  Store all medicines in safety containers that are out of the reach of children.  Keep the phone number of your local poison control  center near your phone or on your cell phone.  Get help if you are struggling with alcohol or drug use.  Get help if you are struggling with depression or another mental health problem.  Keep all follow-up visits as directed by your health care provider. This is important. SEEK MEDICAL CARE IF:  Your symptoms return.  You develop any new signs or symptoms when you are taking medicines. SEEK IMMEDIATE MEDICAL CARE IF:  You think that you or someone else may have taken too much of a drug. The hotline of the [facility] is [phone].  You or someone else is having symptoms of a drug overdose.  You have serious thoughts about hurting yourself or others.  You have chest pain.  You have difficulty breathing.  You have a loss of consciousness. Drug overdose is an emergency. Do not wait to see if the symptoms will go away. Get medical help right away. Call your local emergency services (911 in the [place] Do not drive yourself to the hospital.   This information is not intended to replace advice given to you by your health care provider. Make sure you discuss any questions you have with your health care provider.   Document Released: 04/18/2015 Document Reviewed: 04/18/2015 Elsevier Interactive Patient Education [employer]  Schizophrenia Schizophrenia is a mental illness. It may cause disturbed or disorganized thinking, speech, or behavior. People with schizophrenia have problems functioning in one or more areas of life: work, school, home, or relationships. People with schizophrenia are at increased risk for suicide, certain chronic physical illnesses, and unhealthy behaviors, such as smoking and drug use. People who have family members with schizophrenia are at higher risk of developing the illness. Schizophrenia affects men and women equally but usually appears at an earlier age (teenage or early adult years) in men.  SYMPTOMS The earliest symptoms are  often subtle (prodrome) and may go unnoticed until the illness becomes more severe (first-break psychosis). Symptoms of schizophrenia may be continuous or may come and go in severity. Episodes often are triggered by major life events, such as family stress, college, [employer], marriage, pregnancy or child birth, divorce, or loss of a loved one. People with schizophrenia may see, hear, or feel things that do not exist (hallucinations). They may have false beliefs in spite of obvious proof to the contrary (delusions). Sometimes speech is incoherent or behavior is odd or withdrawn.  DIAGNOSIS Schizophrenia is diagnosed through an assessment by your caregiver. Your caregiver will ask questions about your thoughts, behavior, mood, and ability to function in daily life. Your caregiver may ask questions about your medical history and use of alcohol or drugs, including prescription medication. Your caregiver may also order blood tests and imaging exams. Certain medical conditions and substances can cause symptoms that resemble schizophrenia. Your caregiver may refer you to a mental health specialist for evaluation. There are three major criterion for a diagnosis of schizophrenia:  Two or more of the following five symptoms are present for a month or longer:  Delusions. Often the delusions are that you are being attacked,  harassed, cheated, persecuted or conspired against (persecutory delusions).  Hallucinations.   Disorganized speech that does not make sense to others.  Grossly disorganized (confused or unfocused) behavior or extremely overactive or underactive motor activity (catatonia).  Negative symptoms such as bland or blunted emotions (flat affect), loss of will power (avolition), and withdrawal from social contacts (social isolation).  Level of functioning in one or more major areas of life (work, school, relationships, or self-care) is markedly below the level of functioning before the onset  of illness.   There are continuous signs of illness (either mild symptoms or decreased level of functioning) for at least 6 months or longer. TREATMENT  Schizophrenia is a long-term illness. It is best controlled with continuous treatment rather than treatment only when symptoms occur. The following treatments are used to manage schizophrenia:  Medication--Medication is the most effective and important form of treatment for schizophrenia. Antipsychotic medications are usually prescribed to help manage schizophrenia. Other types of medication may be added to relieve any symptoms that may occur despite the use of antipsychotic medications.  Counseling or talk therapy--Individual, group, or family counseling may be helpful in providing education, support, and guidance. Many people with schizophrenia also benefit from social skills and job skills (vocational) training. A combination of medication and counseling is best for managing the disorder over time. A procedure in which electricity is applied to the brain through the scalp (electroconvulsive therapy) may be used to treat catatonic schizophrenia or schizophrenia in people who cannot take or do not respond to medication and counseling.   This information is not intended to replace advice given to you by your health care provider. Make sure you discuss any questions you have with your health care provider.   Document Released: 11/29/2000 Document Revised: 12/23/2014 Document Reviewed: 02/24/2013 Elsevier Interactive Patient Education [employer] Elsevier Inc.

## 2016-05-05 NOTE — ED Notes (Signed)
Empty pill bottles pt brought in [place] [place] [facility]

## 2016-05-05 NOTE — ED Provider Notes (Signed)
-----------------------------------------   8:01 AM on 05/05/2016 -----------------------------------------  ED ECG REPORT I, Capria Cartaya, the attending physician, personally viewed and interpreted this ECG.  Date: 05/05/2016 EKG Time: 8 AM Rate: 60 Rhythm: normal sinus rhythm QRS Axis: normal Intervals: normal ST/T Wave abnormalities: Early repolarization Conduction Disturbances: none Narrative Interpretation: unremarkable aside from early repolarization, normal QTC and intervals   Sharyn CreamerMark Guenther Dunshee, MD 05/05/16 [phone]

## 2016-05-05 NOTE — ED Notes (Signed)

## 2016-05-05 NOTE — ED Provider Notes (Signed)
Patient remains medically stable, cleared by psychiatry for discharge.  Joshua FilbertJonathan E Williams, MD 05/05/16 [phone]

## 2016-07-10 ENCOUNTER — Emergency Department
Admission: EM | Admit: 2016-07-10 | Discharge: 2016-07-11 | Disposition: A | Discharge status: Other Acute Inpatient Hospital | Payer: Self-pay | Attending: Emergency Medicine | Admitting: Emergency Medicine

## 2016-07-10 ENCOUNTER — Encounter: Payer: Self-pay | Admitting: Emergency Medicine

## 2016-07-10 DIAGNOSIS — R4689 Other symptoms and signs involving appearance and behavior: Secondary | ICD-10-CM

## 2016-07-10 DIAGNOSIS — Z9119 Patient's noncompliance with other medical treatment and regimen: Secondary | ICD-10-CM

## 2016-07-10 DIAGNOSIS — F209 Schizophrenia, unspecified: Secondary | ICD-10-CM | POA: Insufficient documentation

## 2016-07-10 DIAGNOSIS — F1290 Cannabis use, unspecified, uncomplicated: Secondary | ICD-10-CM | POA: Insufficient documentation

## 2016-07-10 DIAGNOSIS — Z91199 Patient's noncompliance with other medical treatment and regimen due to unspecified reason: Secondary | ICD-10-CM

## 2016-07-10 DIAGNOSIS — F1020 Alcohol dependence, uncomplicated: Secondary | ICD-10-CM | POA: Diagnosis present

## 2016-07-10 DIAGNOSIS — F17210 Nicotine dependence, cigarettes, uncomplicated: Secondary | ICD-10-CM | POA: Insufficient documentation

## 2016-07-10 DIAGNOSIS — Z79899 Other long term (current) drug therapy: Secondary | ICD-10-CM | POA: Insufficient documentation

## 2016-07-10 DIAGNOSIS — F919 Conduct disorder, unspecified: Secondary | ICD-10-CM | POA: Insufficient documentation

## 2016-07-10 DIAGNOSIS — F319 Bipolar disorder, unspecified: Secondary | ICD-10-CM | POA: Insufficient documentation

## 2016-07-10 LAB — COMPREHENSIVE METABOLIC PANEL
ALBUMIN: 4.8 g/dL (ref 3.5–5.0)
ALK PHOS: 53 U/L (ref 38–126)
ALT: 15 U/L — AB (ref 17–63)
AST: 30 U/L (ref 15–41)
Anion gap: 12 (ref 5–15)
BUN: 10 mg/dL (ref 6–20)
CALCIUM: 9.3 mg/dL (ref 8.9–10.3)
CO2: 20 mmol/L — ABNORMAL LOW (ref 22–32)
CREATININE: 1.18 mg/dL (ref 0.61–1.24)
Chloride: 108 mmol/L (ref 101–111)
GFR calc Af Amer: >60 mL/min (ref >60)
GFR calc non Af Amer: >60 mL/min (ref >60)
GLUCOSE: 100 mg/dL — AB (ref 65–99)
POTASSIUM: 4.1 mmol/L (ref 3.5–5.1)
SODIUM: 140 mmol/L (ref 135–145)
Total Bilirubin: 0.7 mg/dL (ref 0.3–1.2)
Total Protein: 7.5 g/dL (ref 6.5–8.1)

## 2016-07-10 LAB — URINE DRUG SCREEN, QUALITATIVE (ARMC ONLY)
Amphetamines, Ur Screen: NOT DETECTED
BARBITURATES, UR SCREEN: NOT DETECTED
Benzodiazepine, Ur Scrn: NOT DETECTED
CANNABINOID 50 NG, UR ~~LOC~~: POSITIVE — AB
COCAINE METABOLITE, UR ~~LOC~~: NOT DETECTED
MDMA (Ecstasy)Ur Screen: NOT DETECTED
Methadone Scn, Ur: NOT DETECTED
OPIATE, UR SCREEN: NOT DETECTED
Phencyclidine (PCP) Ur S: NOT DETECTED
Tricyclic, Ur Screen: NOT DETECTED

## 2016-07-10 LAB — CBC
HEMATOCRIT: 46.3 % (ref 40.0–52.0)
HEMOGLOBIN: 14.8 g/dL (ref 13.0–18.0)
MCH: 28.9 pg (ref 26.0–34.0)
MCHC: 32 g/dL (ref 32.0–36.0)
MCV: 90.3 fL (ref 80.0–100.0)
Platelets: 291 10*3/uL (ref 150–440)
RBC: 5.13 MIL/uL (ref 4.40–5.90)
RDW: 14.5 % (ref 11.5–14.5)
WBC: 17.3 10*3/uL — ABNORMAL HIGH (ref 3.8–10.6)

## 2016-07-10 LAB — SALICYLATE LEVEL: Salicylate Lvl: <4 mg/dL (ref 2.8–30.0)

## 2016-07-10 LAB — ACETAMINOPHEN LEVEL: Acetaminophen (Tylenol), Serum: <10 ug/mL — ABNORMAL LOW (ref 10–30)

## 2016-07-10 LAB — ETHANOL: Alcohol, Ethyl (B): <5 mg/dL (ref <5)
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
## 2016-07-10 NOTE — BH Assessment (Signed)
Referral information for Psych Inpatient have been faxed to;    [facility] ([place]-[phone]), No Marvia Pickles [phone]),    [facility] [phone] or [phone]),    [facility] [phone]),    [facility] [phone]), "No longer have any [facility] funding left."   [facility] ([phone]), Due to being a private hospital and having no insurance, he will have to pay $3,000 prior to being admitted.   [facility] [phone]).    [facility] ([phone]), don't have an appropriate bed. Only have male bed on [address].
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
## 2016-07-11 ENCOUNTER — Inpatient Hospital Stay
Admission: EM | Admit: 2016-07-11 | Discharge: 2016-07-17 | DRG: 885 | Disposition: A | Discharge status: Home / Self Care | Payer: No Typology Code available for payment source | Source: Intra-hospital | Attending: Psychiatry | Admitting: Psychiatry

## 2016-07-11 ENCOUNTER — Encounter: Payer: Self-pay | Admitting: *Deleted

## 2016-07-11 DIAGNOSIS — F1220 Cannabis dependence, uncomplicated: Secondary | ICD-10-CM

## 2016-07-11 DIAGNOSIS — Z9119 Patient's noncompliance with other medical treatment and regimen: Secondary | ICD-10-CM

## 2016-07-11 DIAGNOSIS — F200 Paranoid schizophrenia: Secondary | ICD-10-CM

## 2016-07-11 DIAGNOSIS — F17200 Nicotine dependence, unspecified, uncomplicated: Secondary | ICD-10-CM

## 2016-07-11 DIAGNOSIS — F259 Schizoaffective disorder, unspecified: Secondary | ICD-10-CM | POA: Diagnosis present

## 2016-07-11 DIAGNOSIS — Z811 Family history of alcohol abuse and dependence: Secondary | ICD-10-CM | POA: Diagnosis not present

## 2016-07-11 DIAGNOSIS — F17210 Nicotine dependence, cigarettes, uncomplicated: Secondary | ICD-10-CM | POA: Diagnosis present

## 2016-07-11 DIAGNOSIS — G4700 Insomnia, unspecified: Secondary | ICD-10-CM | POA: Diagnosis present

## 2016-07-11 DIAGNOSIS — Z91199 Patient's noncompliance with other medical treatment and regimen due to unspecified reason: Secondary | ICD-10-CM

## 2016-07-11 DIAGNOSIS — F250 Schizoaffective disorder, bipolar type: Secondary | ICD-10-CM

## 2016-07-11 DIAGNOSIS — F319 Bipolar disorder, unspecified: Secondary | ICD-10-CM | POA: Diagnosis present

## 2016-07-11 DIAGNOSIS — Z9114 Patient's other noncompliance with medication regimen: Secondary | ICD-10-CM | POA: Diagnosis not present

## 2016-07-11 DIAGNOSIS — Z915 Personal history of self-harm: Secondary | ICD-10-CM | POA: Diagnosis not present

## 2016-07-11 DIAGNOSIS — F1020 Alcohol dependence, uncomplicated: Secondary | ICD-10-CM | POA: Diagnosis present

## 2016-07-11 LAB — LIPID PANEL
Cholesterol: 152 mg/dL (ref 0–200)
HDL: 56 mg/dL (ref >40)
LDL CALC: 70 mg/dL (ref 0–99)
Total CHOL/HDL Ratio: 2.7 RATIO
Triglycerides: 130 mg/dL (ref <150)
VLDL: 26 mg/dL (ref 0–40)

## 2016-07-11 LAB — TSH: TSH: 1.274 u[IU]/mL (ref 0.350–4.500)

## 2016-07-11 MED ORDER — HALOPERIDOL 5 MG PO TABS
5.0000 mg | ORAL_TABLET | Freq: Every day | ORAL | Status: DC
  Administered 2016-07-11: 5 mg via ORAL
  Filled 2016-07-11: qty 1

## 2016-07-11 MED ORDER — HALOPERIDOL 5 MG PO TABS: 5.0000 mg | ORAL_TABLET | Freq: Every day | ORAL | Status: DC

## 2016-07-11 MED ORDER — NICOTINE 21 MG/24HR TD PT24
21.0000 mg | MEDICATED_PATCH | Freq: Every day | TRANSDERMAL | Status: DC
  Administered 2016-07-11 – 2016-07-17 (×7): 21 mg via TRANSDERMAL
  Filled 2016-07-11 (×7): qty 1

## 2016-07-11 MED ORDER — BENZTROPINE MESYLATE 1 MG PO TABS
1.0000 mg | ORAL_TABLET | Freq: Every day | ORAL | Status: DC
  Administered 2016-07-11: 1 mg via ORAL
  Filled 2016-07-11: qty 1

## 2016-07-11 MED ORDER — ACETAMINOPHEN 325 MG PO TABS: 650.0000 mg | ORAL_TABLET | Freq: Four times a day (QID) | ORAL | Status: DC | PRN

## 2016-07-11 MED ORDER — MAGNESIUM HYDROXIDE 400 MG/5ML PO SUSP: 30.0000 mL | Freq: Every day | ORAL | Status: DC | PRN

## 2016-07-11 MED ORDER — BENZTROPINE MESYLATE 1 MG PO TABS: 1.0000 mg | ORAL_TABLET | Freq: Every day | ORAL | Status: DC

## 2016-07-11 MED ORDER — ALUM & MAG HYDROXIDE-SIMETH 200-200-20 MG/5ML PO SUSP: 30.0000 mL | ORAL | Status: DC | PRN

## 2016-07-11 NOTE — ED Notes (Signed)
BEHAVIORAL HEALTH ROUNDING Patient sleeping: Yes.   Patient alert and oriented: not applicable SLEEPING Behavior appropriate: Yes.  ; If no, describe: SLEEPING Nutrition and fluids offered: No SLEEPING Toileting and hygiene offered: NoSLEEPING Sitter present: not applicable, Q 15 min safety rounds and observation via security camera. Law enforcement present: Yes ODS 

## 2016-07-11 NOTE — ED Notes (Signed)
Patient in dayroom. He has been calm and cooperative. Maintained on 15 minute checks and observation by security camera for safety.

## 2016-07-11 NOTE — ED Notes (Signed)
Patient in dayroom. Offers no complaints. Calm and cooperative.Maintained on 15 minute checks and observation by security camera for safety.

## 2016-07-11 NOTE — ED Notes (Signed)
Patient resting quietly in room. No noted distress or abnormal behaviors noted. Will continue 15 minute checks and observation by security camera for safety. 

## 2016-07-11 NOTE — Progress Notes (Signed)
Patient is to be admitted to [facility] by Dr. Toni Amend.  Attending Physician will be Dr. Ardyth Harps.   Patient has been assigned to room 315, by [facility] Charge Nurse Gwen.   Intake Paper Work has been signed and placed on patient chart.  ER staff is aware of the admission [facility] ER Sect.;  ER MD; Amy  Patient's Nurse & Jaclynn Guarneri Patient Access).  07/11/2016 Cheryl Flash, MS, NCC, LPCA Therapeutic Triage Specialist

## 2016-07-11 NOTE — Progress Notes (Signed)
Pt being reviewed for possible admission to [facility]. H&P and Assessment have been faxed to the [facility] for the charge nurse to review and provide bed assignment.      Nicole Carrissa Taitano, MS, NCC, LPCA Therapeutic Triage Specialist    

## 2016-07-11 NOTE — ED Notes (Signed)
Patient awake, alert, and oriented. Cooperative with all nursing interventions. Patient able to talk about behaviors which resulted in hospitalization. He currently denies SI or HI. Maintained on 15 minute checks and observation by security camera for safety.

## 2016-07-11 NOTE — ED Notes (Signed)
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
## 2016-07-11 NOTE — ED Provider Notes (Signed)
-----------------------------------------   6:13 AM on 07/11/2016 -----------------------------------------   Blood pressure 106/67, pulse (!) 115, temperature 98.6 F (37 C), temperature source Oral, resp. rate 20, height 6\' 1"  (1.854 m), weight 132 lb (59.9 kg), SpO2 95 %.  The patient had no acute events since last update.  Calm and cooperative at this time.  Disposition is pending per Psychiatry/Behavioral Medicine team recommendations.     Irean Hong, MD 07/11/16 [phone]

## 2016-07-11 NOTE — ED Notes (Signed)
Patient to be transferred to [facility] unit for continued treatment. Patient agrees with this. All personal belongings sent with patient.

## 2016-07-11 NOTE — ED Notes (Signed)
Patient asleep in room. No noted distress or abnormal behavior. Will continue 15 minute checks and observation by security cameras for safety. 

## 2016-07-11 NOTE — ED Notes (Signed)
Patient meeting with the psychiatrist.

## 2016-07-11 NOTE — ED Notes (Signed)
ENVIRONMENTAL ASSESSMENT  Potentially harmful objects out of patient reach: Yes.  Personal belongings secured: Yes.  Patient dressed in hospital provided attire only: Yes.  Plastic bags out of patient reach: Yes.  Patient care equipment (cords, cables, call bells, lines, and drains) shortened, removed, or accounted for: Yes.  Equipment and supplies removed from bottom of stretcher: Yes.  Potentially toxic materials out of patient reach: Yes.  Sharps container removed or out of patient reach: Yes.   BEHAVIORAL HEALTH ROUNDING Patient sleeping: Yes.   Patient alert and oriented: not applicable SLEEPING Behavior appropriate: Yes.  ; If no, describe: SLEEPING Nutrition and fluids offered: No SLEEPING Toileting and hygiene offered: NoSLEEPING Sitter present: not applicable, Q 15 min safety rounds and observation via security camera. Law enforcement present: Yes ODS  

## 2016-07-11 NOTE — Tx Team (Signed)
Initial Interdisciplinary Treatment Plan   PATIENT STRESSORS: Medication change or noncompliance Substance abuse   PATIENT STRENGTHS: Ability for insight Average or above average intelligence Communication skills Supportive family/friends   PROBLEM LIST: Problem List/Patient Goals Date to be addressed Date deferred Reason deferred Estimated date of resolution  "attend substance abuse program"      homeless      Medication non compliance                                           DISCHARGE CRITERIA:  Ability to meet basic life and health needs Adequate post-discharge living arrangements Verbal commitment to aftercare and medication compliance  PRELIMINARY DISCHARGE PLAN: Outpatient therapy  PATIENT/FAMIILY INVOLVEMENT: This treatment plan has been presented to and reviewed with the patient, Joshua [name], and/or family member, .  The patient and family have been given the opportunity to ask questions and make suggestions.  Joshua [name] B 07/11/2016, 7:06 PM

## 2016-07-12 ENCOUNTER — Encounter: Payer: Self-pay | Admitting: Psychiatry

## 2016-07-12 DIAGNOSIS — F1220 Cannabis dependence, uncomplicated: Secondary | ICD-10-CM

## 2016-07-12 DIAGNOSIS — F17200 Nicotine dependence, unspecified, uncomplicated: Secondary | ICD-10-CM

## 2016-07-12 DIAGNOSIS — F319 Bipolar disorder, unspecified: Secondary | ICD-10-CM

## 2016-07-12 LAB — HEMOGLOBIN A1C: Hgb A1c MFr Bld: 4.7 % (ref 4.0–6.0)

## 2016-07-12 LAB — PROLACTIN: PROLACTIN: 5.5 ng/mL (ref 4.0–15.2)

## 2016-07-12 MED ORDER — LORAZEPAM 2 MG PO TABS
2.0000 mg | ORAL_TABLET | ORAL | Status: DC | PRN
  Administered 2016-07-12: 2 mg via ORAL
  Filled 2016-07-12: qty 1

## 2016-07-12 MED ORDER — ARIPIPRAZOLE 5 MG PO TABS
15.0000 mg | ORAL_TABLET | Freq: Every day | ORAL | Status: DC
  Administered 2016-07-12 – 2016-07-13 (×2): 15 mg via ORAL
  Filled 2016-07-12 (×2): qty 3

## 2016-07-12 MED ORDER — TRAZODONE HCL 100 MG PO TABS
100.0000 mg | ORAL_TABLET | Freq: Every evening | ORAL | Status: DC | PRN
  Administered 2016-07-12 – 2016-07-16 (×3): 100 mg via ORAL
  Filled 2016-07-12 (×3): qty 1

## 2016-07-12 NOTE — Social Work (Addendum)
CSW spoke with [facility] care coordinator, Shelby Dubin who will meet with patient Monday, 7/31 @ 1:30PM in regards to discharge planning.    Hampton Abbot, MSW, LCSW-A 07/12/16   3:49PM

## 2016-07-12 NOTE — Tx Team (Signed)
Interdisciplinary Treatment Plan Update (Adult)         Date: 07/12/2016   Time Reviewed: 10:30 AM   Progress in Treatment: Improving Attending groups: Yes  Participating in groups: Yes  Taking medication as prescribed: Yes  Tolerating medication: Yes  Family/Significant other contact made: CSW is still assessing proper contacts Patient understands diagnosis: Yes  Discussing patient identified problems/goals with staff: Yes  Medical problems stabilized or resolved: Yes  Denies suicidal/homicidal ideation: Yes  Issues/concerns per patient self-inventory: Yes  Other:   New problem(s) identified: N/A   Discharge Plan or Barriers: see below   Reason for Continuation of Hospitalization:   Depression   Anxiety   Medication Stabilization   Comments: N/A   Estimated length of stay: 3-5 days    Patient is a 25 year old male admitted for homicidal ideation and aggressive behavior. Patient lives in [place], [place]. Patient will benefit from crisis stabilization, medication evaluation, group therapy, and psycho education in addition to case management for discharge planning. Patient and CSW reviewed pt's identified goals and treatment plan. Pt verbalized understanding and agreed to treatment plan.    Review of initial/current patient goals per problem list:  1. Goal(s): Patient will participate in aftercare plan   Met: Goal progressing  Target date: 3-5 days post admission date   As evidenced by: Patient will participate within aftercare plan AEB aftercare provider and housing plan at discharge being identified.   07/12/16: Pt follows up with [facility] - will be referred for SAIOP.   2. Goal (s): Patient will exhibit decreased depressive symptoms and suicidal ideations.   Met: Goal progressing  Target date: 3-5 days post admission date   As evidenced by: Patient will utilize self-rating of depression at 3 or below and demonstrate decreased signs of depression or be deemed  stable for discharge by MD.   07/12/16: Pt denies SI at this time. Rates a depression score of a 4 at this time.  3. Goal(s): Patient will demonstrate decreased signs and symptoms of anxiety.   Met: Goal progressing   Target date: 3-5 days post admission date   As evidenced by: Patient will utilize self-rating of anxiety at 3 or below and demonstrated decreased signs of anxiety, or be deemed stable for discharge by MD   07/12/16: Patient reports an anxiety score of a 4 at this time.   Attendees:  Patient: Joshua [name] Family:  Physician: Merlyn Albert, MD   07/12/2016 10:30 AM  Nursing: Floyde Parkins , RN    07/12/2016 10:30 AM  Clinical Social Worker: Glorious Peach, LCSWA7/28/2017 10:30 AM

## 2016-07-12 NOTE — [facility] Group Notes (Signed)
[facility] LCSW Group Therapy   07/12/2016  9:30am  Type of Therapy: Group Therapy   Participation Level: Did Not Attend. Patient invited to participate but declined.    Chirsty Armistead F. Ritter Helsley, MSW, LCSWA, LCAS     

## 2016-07-12 NOTE — Plan of Care (Signed)
Problem: Safety: Goal: Ability to remain free from injury will improve Outcome: Progressing Pt has remained free from injury   

## 2016-07-12 NOTE — [facility] Suicide Risk Assessment (Signed)
[facility] Admission Suicide Risk Assessment   Nursing information obtained from:    Demographic factors:    Current Mental Status:    Loss Factors:    Historical Factors:    Risk Reduction Factors:     Total Time spent with patient: 1 hour Principal Problem: Bipolar I disorder (HCC) Diagnosis:   Patient Active Problem List   Diagnosis Date Noted  . Cannabis use disorder, moderate, dependence ([facility]) [F12.20] 07/12/2016  . Tobacco use disorder [F17.200] 07/12/2016  . Bipolar I disorder ([facility]) [F31.9] 07/12/2016  . Noncompliance [Z91.19] 07/11/2016  . Alcohol use disorder, moderate, dependence ([facility]) [F10.20]    Subjective Data:   Continued Clinical Symptoms:  Alcohol Use Disorder Identification Test Final Score (AUDIT): 2 The "Alcohol Use Disorders Identification Test", Guidelines for Use in Primary Care, Second Edition.  World Science writer [facility]). Score between 0-7:  no or low risk or alcohol related problems. Score between 8-15:  moderate risk of alcohol related problems. Score between 16-19:  high risk of alcohol related problems. Score 20 or above:  warrants further diagnostic evaluation for alcohol dependence and treatment.     Psychiatric Specialty Exam: Physical Exam  Constitutional: He is oriented to person, place, and time. He appears well-developed and well-nourished.  HENT:  Head: Normocephalic and atraumatic.  Eyes: Conjunctivae and EOM are normal.  Neck: Normal range of motion.  Respiratory: Effort normal.  Musculoskeletal: Normal range of motion.  Neurological: He is alert and oriented to person, place, and time.    ROS  Blood pressure 128/73, pulse 66, temperature 98.2 F (36.8 C), temperature source Oral, resp. rate (!) 66, height 6\' 1"  (1.854 m), weight 58.5 kg (129 lb), SpO2 100 %.Body mass index is 17.02 kg/m.                                                    Sleep:  Number of Hours: 7.75      COGNITIVE FEATURES THAT  CONTRIBUTE TO RISK:  Closed-mindedness    SUICIDE RISK:   Moderate:  Frequent suicidal ideation with limited intensity, and duration, some specificity in terms of plans, no associated intent, good self-control, limited dysphoria/symptomatology, some risk factors present, and identifiable protective factors, including available and accessible social support.   PLAN OF CARE: admit to [facility]  I certify that inpatient services furnished can reasonably be expected to improve the patient's condition.  Jimmy Footman, MD 07/12/2016, 12:56 PM
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
## 2016-07-12 NOTE — Progress Notes (Signed)
D: Observed pt in day room, interacting minimally with peers. Patient alert and oriented x4. Patient denies SI/HI/AVH. Pt affect is anxious. Pt forwarded little, took medications, and then went to sleep. Pt did mention allergies acting up, but pt fell asleep before writer could follow up. A: Offered active listening and support. Provided therapeutic communication. Administered scheduled medications.  R: Pt pleasant and cooperative. Pt medication compliant. Will continue Q15 min. checks. Safety maintained.
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
## 2016-07-13 MED ORDER — ARIPIPRAZOLE 10 MG PO TABS
20.0000 mg | ORAL_TABLET | Freq: Every day | ORAL | Status: DC
  Administered 2016-07-14: 20 mg via ORAL
  Filled 2016-07-13: qty 2

## 2016-07-13 MED ORDER — ARIPIPRAZOLE 5 MG PO TABS
5.0000 mg | ORAL_TABLET | Freq: Once | ORAL | Status: AC
Start: 2016-07-13 — End: 2016-07-13
  Administered 2016-07-13: 5 mg via ORAL
  Filled 2016-07-13: qty 1

## 2016-07-13 NOTE — [facility] Group Notes (Signed)
[facility] LCSW Group Therapy  07/13/2016 3:07 PM  Type of Therapy:  Group Therapy  Participation Level:  Active  Participation Quality:  Appropriate  Affect:  Appropriate  Cognitive:  Alert  Insight:  Engaged  Engagement in Therapy:  Improving  Modes of Intervention:  Activity, Discussion, Education, Dance movement psychotherapist and Support  Summary of Progress/Problems: Self care: Patients discussed self care and how it impacts them. Patients were asked to define self care in their own words. They discussed what aspects in their lives has influenced their self care. Patients also discussed self care in the areas of self regulation/control, hygiene/appearance, sleep/relaxation, healthy leisure, healthy eating habits, exercise, inner peace/spirituality, self improvement, sobriety, and health management. They were challenged to identify changes that are needed in order to improve self care. Pt was sharing during group and stated he needed to improve his self care due to him always taking care of others first. CSW provided support and offered suggestions to improve self-care in selected areas.   Arelia Longest 07/13/2016, 3:17 PM

## 2016-07-13 NOTE — Progress Notes (Signed)
D: Pt denies SI/HI/AVH. Pt is pleasant and cooperative. Pt's affect is flat and sad, he appears less anxious and he is interacting with peers and staff appropriately.  A: Pt was offered support and encouragement. Pt was given scheduled medications. Pt was encouraged to attend groups. Q 15 minute checks were done for safety.  R:Pt attends groups and interacts well with peers and staff. Pt is taking medication. Pt has no complaints.Pt receptive to treatment and safety maintained on unit.

## 2016-07-13 NOTE — [facility] Group Notes (Signed)
[facility] Group Notes:  (Nursing/MHT/Case Management/Adjunct)  Date:  07/13/2016  Time:  9:24 PM  Type of Therapy:  Evening Wrap-up Group  Participation Level:  Active  Participation Quality:  Appropriate and Attentive  Affect:  Appropriate  Cognitive:  Alert and Appropriate  Insight:  Appropriate, Good and Improving  Engagement in Group:  Developing/Improving and Engaged  Modes of Intervention:  Activity  Summary of Progress/Problems:  Joshua [name] 07/13/2016, 9:24 PM

## 2016-07-13 NOTE — Plan of Care (Signed)
Problem: Coping: Goal: Ability to verbalize feelings will improve Outcome: Progressing Patient verbalizing feelings.      
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
## 2016-07-14 MED ORDER — ARIPIPRAZOLE 10 MG PO TABS
30.0000 mg | ORAL_TABLET | Freq: Every day | ORAL | Status: DC
  Administered 2016-07-15 – 2016-07-17 (×3): 30 mg via ORAL
  Filled 2016-07-14 (×3): qty 3
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
## 2016-07-14 NOTE — Plan of Care (Signed)
Problem: Safety: Goal: Ability to remain free from injury will improve Outcome: Progressing Pt has remained free from injury   

## 2016-07-14 NOTE — [facility] Group Notes (Signed)
[facility] Group Notes:  (Nursing/MHT/Case Management/Adjunct)  Date:  07/14/2016  Time:  10:42 PM  Type of Therapy:  Psychoeducational Skills  Participation Level:  Active  Participation Quality:  Appropriate  Affect:  Appropriate  Cognitive:  Appropriate  Insight:  Appropriate and Good  Engagement in Group:  Engaged  Modes of Intervention:  Discussion, Socialization and Support  Summary of Progress/Problems:  Joshua [name] 07/14/2016, 10:42 PM
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
## 2016-07-14 NOTE — [facility] Group Notes (Signed)
[facility] LCSW Group Therapy  07/14/2016 3:00 PM  Type of Therapy:  Group Therapy  Participation Level:  Active  Participation Quality:  Attentive  Affect:  Appropriate and Blunted  Cognitive:  Alert  Insight:  Improving  Engagement in Therapy:  Engaged  Modes of Intervention:  Activity, Discussion, Education, Dance movement psychotherapist and Support  Summary of Progress/Problems: Self-responsibility/accountability- Patients discussed self responsibility/accountability  and how it impacts them. Patients were asked to define these concepts in their own words. They discussed taking ownership of their actions and the challenges they have with taking accountability for their self. CSW introduced "YOU vs. I" statements and explained how "I" statements identifies how they feel about the situation without being threatening or offensive to others and could help improve their communication with others. Examples were provided of each. They were challenged to identify changes that are needed in order to improve self responsibility/accountability. CSW provided inspirational quotes that focused on the patients taking accountability for actions both good and bad. Patients were asked to read the quotes out loud and share their thoughts with the group about their quote. Pt was active during group and was willing to share about some of his experiences. Pt developed some insight to accountability but needed clarity about what accountability is. Pt was willing to read his quotes out loud and provide his thoughts on the quote.   Yoseline Andersson G. Garnette Czech MSW, LCSWA 07/14/2016, 3:02 PM

## 2016-07-14 NOTE — Progress Notes (Signed)
D: Observed pt in dayroom interacting with peers. Patient alert and oriented x4. Patient denies SI/HI/AVH. Pt affect is anxious, although pt denied feeling anxious or depressed. Pt stated his day was "pretty good" and that socializing with peers and talking to his mom helped improve his day. Pt attending groups and talked briefly about learning about "balance" in one of the groups. Pt had no complaints or concerns. A: Offered active listening and support. Provided therapeutic communication. Administered scheduled medications. Encouraged pt to continue attending group, and encouraged to practice coping skills learned in group. Gave trazodone prn for sleep. R: Pt pleasant and cooperative. Pt had no scheduled meds, and was only given trazodone prn. Will continue Q15 min. checks. Safety maintained.

## 2016-07-15 MED ORDER — ARIPIPRAZOLE ER 400 MG IM SUSR
400.0000 mg | INTRAMUSCULAR | Status: DC
  Administered 2016-07-15: 400 mg via INTRAMUSCULAR
  Filled 2016-07-15: qty 400

## 2016-07-15 NOTE — Progress Notes (Signed)
D:  Per pt self inventory pt reports sleeping good, appetite good, energy level normal, ability to pay attention good, rates depression at a 0 out of 10, hopelessness at a 0 out of 10, anxiety at a 0 out of 10, denies SI/HI/AVH, goal today: "to stay focus on goals and meditate"     A:  Emotional support provided, Encouraged pt to continue with treatment plan and attend all group activities, q15 min checks maintained for safety.  R:  Pt is receptive, going to groups, pleasant and cooperative with staff and other patients on the unit.

## 2016-07-15 NOTE — Progress Notes (Signed)
D: Patient remains pleasant on the unit. Stated his sister and brother in law visited and he's allowed to stay with them if he wants. He denies SI/HI/AVH. Denies pain. Attended group and visible in the milieu.  A: Medication given with education. Encouragement provided.  R: Patient has been compliant with medication. He has remained calm and cooperative. Safety maintained with 15 min checks.

## 2016-07-15 NOTE — [facility] Group Notes (Signed)
[facility] Group Notes:  (Nursing/MHT/Case Management/Adjunct)  Date:  07/15/2016  Time:  9:08 PM  Type of Therapy:  Group Therapy  Participation Level:  Active  Participation Quality:  Appropriate  Affect:  Appropriate  Cognitive:  Appropriate  Insight:  Appropriate  Engagement in Group:  Engaged  Modes of Intervention:  Discussion  Summary of Progress/Problems:  Joshua [name] 07/15/2016, 9:08 PM

## 2016-07-15 NOTE — Social Work (Signed)
CSW met with [facility], Mayer Camel [phone] and patient in regards to discharge planning and aftercare follow-up. Ms. Grandville Silos went over possible follow-up options with patient - patient most interested in [facility] at this time. CSW contacted hospital liaison, Sherrian Divers from [place] who plans to meet with patient 07/16/16 on regards to this current referral.   Emilie Rutter, MSW, LCSW-A 07/15/16  2:04PM

## 2016-07-15 NOTE — Plan of Care (Signed)
Problem: Health Behavior/Discharge Planning: Goal: Compliance with prescribed medication regimen will improve Outcome: Progressing Patient was compliant with medications during this shift.

## 2016-07-15 NOTE — [facility] Group Notes (Signed)
[facility] LCSW Group Therapy   07/15/2016 1pm Type of Therapy: Group Therapy   Participation Level: Active   Participation Quality: Attentive, Sharing and Supportive   Affect: Appropriate  Cognitive: Alert and Oriented   Insight: Developing/Improving and Engaged   Engagement in Therapy: Developing/Improving and Engaged   Modes of Intervention: Clarification, Confrontation, Discussion, Education, Exploration,  Limit-setting, Orientation, Problem-solving, Rapport Building, Dance movement psychotherapist, Socialization and Support   Summary of Progress/Problems: Pt identified obstacles faced currently and processed barriers involved in overcoming these obstacles. Pt identified steps necessary for overcoming these obstacles and explored motivation (internal and external) for facing these difficulties head on. Pt further identified one area of concern in their lives and chose a goal to focus on for today. Pt identified that the pt's primary goal was achieve a successful recovery from alcohol and substances.  Pt identified a barrier to that goal was "drinking, drugs, anxiety and depression.  Pt shared that the pt intended to get help with medications, practice acceptance and use his coping skills to overcome these barriers.  Pt was polite and cooperative with the CSW and other group members and focused and attentive to the topics discussed and the sharing of others.    Dorothe Pea. Markhi Kleckner, LCSWA, LCAS

## 2016-07-15 NOTE — [facility] Group Notes (Signed)
[facility] Group Notes:  (Nursing/MHT/Case Management/Adjunct)  Date:  07/15/2016  Time:  4:06 PM  Type of Therapy:  Psychoeducational Skills  Participation Level:  Active  Participation Quality:  Appropriate, Sharing and Supportive  Affect:  Appropriate  Cognitive:  Appropriate  Insight:  Appropriate and Good  Engagement in Group:  Engaged and Supportive  Modes of Intervention:  Education, Dentist, Socialization and Support  Summary of Progress/Problems:  Joshua [name] 07/15/2016, 4:06 PM
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
## 2016-07-16 DIAGNOSIS — F250 Schizoaffective disorder, bipolar type: Secondary | ICD-10-CM

## 2016-07-16 MED ORDER — ARIPIPRAZOLE 30 MG PO TABS: 30.0000 mg | ORAL_TABLET | Freq: Every day | ORAL | 0 refills | Status: DC

## 2016-07-16 MED ORDER — ARIPIPRAZOLE ER 400 MG IM SUSR: 400.0000 mg | INTRAMUSCULAR | 0 refills | Status: DC

## 2016-07-16 NOTE — [facility] Group Notes (Signed)
Goals Group  Date/Time: 07/16/2016 9am  Type of Therapy and Topic: Group Therapy: Goals Group: SMART Goals   Pt was called, but did not attend   Fredda Clarida F. Nolen Lindamood, LCSWA, LCAS  

## 2016-07-16 NOTE — [facility] Suicide Risk Assessment (Signed)
[facility] Discharge Suicide Risk Assessment   Principal Problem: Schizoaffective disorder [facility]) Discharge Diagnoses:  Patient Active Problem List   Diagnosis Date Noted  . Schizoaffective disorder (HCC) [F25.9] 07/16/2016  . Cannabis use disorder, moderate, dependence (HCC) [F12.20] 07/12/2016  . Tobacco use disorder [F17.200] 07/12/2016  . Noncompliance [Z91.19] 07/11/2016  . Alcohol use disorder, moderate, dependence (HCC) [F10.20]     Total Time spent with patient: 45 minutes    Psychiatric Specialty Exam: ROS  Blood pressure 110/78, pulse 69, temperature 98.5 F (36.9 C), temperature source Oral, resp. rate 20, height 6\' 1"  (1.854 m), weight 58.5 kg (129 lb), SpO2 100 %.Body mass index is 17.02 kg/m.                                                       Mental Status Per Nursing Assessment::   On Admission:     Demographic Factors:  Male, Adolescent or young adult, Low socioeconomic status and Unemployed  Loss Factors: Decrease in vocational status and Financial problems/change in socioeconomic status  Historical Factors: Prior suicide attempts and Impulsivity  Risk Reduction Factors:   Positive social support  Continued Clinical Symptoms:  Alcohol/Substance Abuse/Dependencies Previous Psychiatric Diagnoses and Treatments  Cognitive Features That Contribute To Risk:  Closed-mindedness    Suicide Risk:  Minimal: No identifiable suicidal ideation.  Patients presenting with no risk factors but with morbid ruminations; may be classified as minimal risk based on the severity of the depressive symptoms  Follow-up Information    [facility]. Go on 07/18/2016.   Why:  Please arrive to the walk-in clinic between the hours of 8am for a hospital follow-up for medication management and therapy.  Arrive as early as possible for prompt service.  Please call Unk Pinto at [phone] for questions and assistance. Contact information:    [facility] [address] [place] [address] Ph: [phone] Fax: [phone]           Jimmy Footman, MD 07/17/2016, 9:01 AM
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
## 2016-07-16 NOTE — [facility] Group Notes (Signed)
[facility] LCSW Group Therapy   07/16/2016 2pm  Type of Therapy: Group Therapy   Participation Level: Active   Participation Quality: Attentive, Sharing and Supportive   Affect: Appropriate  Cognitive: Alert and Oriented   Insight: Developing/Improving and Engaged   Engagement in Therapy: Developing/Improving and Engaged   Modes of Intervention: Clarification, Confrontation, Discussion, Education, Exploration,  Limit-setting, Orientation, Problem-solving, Rapport Building, Dance movement psychotherapist, Socialization and Support  Summary of Progress/Problems: The topic for group therapy was feelings about diagnosis. Pt actively participated in group discussion on their past and current diagnosis and how they feel towards this. Pt also identified how society and family members judge them, based on their diagnosis as well as stereotypes and stigmas. Pt reported he had been born and raised in the this area. Pt reported that the pt's diagnosis bi-polar schizophrenia and that the pt agreed with this diagnosis.  Pt acknowledged that this was not a negative description of the pt, that this diagnosis was a description of the pt's reported symptoms.  Pt reported that the pt viewed the pt's diagnosis of bi-polar schizophrenia as a positive factor, in that, the pt can now treat the pt's diagnosis effectively.  Pt reported feeling hopeful for the future despite the pt's diagnosis.  Pt reported he can use his difficulties with his diagnosis to write a book and that he hopes to "publish a video game" CSW actively validated the pt's opinion and provided feedback. Pt was polite and cooperative with the CSW and other group members and focused and attentive to the topics discussed and the sharing of others.      Dorothe Pea. Farrel Guimond, LCSWA, LCAS

## 2016-07-16 NOTE — Progress Notes (Signed)
Recreation Therapy Notes  Date: 08.01.17 Time: 9:30 am Location: Craft Room  Group Topic: Self-expression  Goal Area(s) Addresses:  Patient will identify one color per emotion listed on wheel. Patient will verbalize benefit of using art as a means of self-expression. Patient will verbalize one emotion experienced during session. Patient will be educated on other forms of self-expression.  Behavioral Response: Attentive, Interactive  Intervention: Emotion Wheel  Activity: Patients were given an Emotion Wheel worksheet and instructed to pick a color for each emotion that was listed on the wheel.  Education: LRT educated patients on other forms of self-expression.  Education Outcome: Acknowledges education/In group clarification offered  Clinical Observations/Feedback: Patient completed activity by picking a color for each emotion. Patient contributed to group discussion by stating what colors he picked for certain emotions and why he picked them, and how it felt to see his emotions in color.   Jacquelynn Cree, LRT/CTRS 07/16/2016 10:27 AM

## 2016-07-16 NOTE — Progress Notes (Deleted)
  [facility] Adult Case Management Discharge Plan :  Will you be returning to the same living situation after discharge:  Yes,  home with family. At discharge, do you have transportation home?: Yes,  mother will pick pt up. Do you have the ability to pay for your medications: Yes,  [facility] Management Clinic  Release of information consent forms completed and in the chart;  Patient's signature needed at discharge.  Patient to Follow up at:   Next level of care provider has access to [facility] Link:no  Safety Planning and Suicide Prevention discussed: Yes,  SPE reviewed with mother and patient.  Have you used any form of tobacco in the last 30 days? (Cigarettes, Smokeless Tobacco, Cigars, and/or Pipes): Yes  Has patient been referred to the Quitline?: Patient refused referral  Patient has been referred for addiction treatment: Yes -[facility] at [facility].  Lynden Oxford, MSW, LCSW-A 07/16/2016, 10:58 AM

## 2016-07-16 NOTE — Progress Notes (Signed)
Patient with appropriate affect, cooperative behavior with meals, meds and plan of care. No SI/HI/AVH at this time. Social with peers, Joshua [name] needs appropriately with staff. Attends therapy groups. Safety maintained.

## 2016-07-16 NOTE — [facility] Suicide Risk Assessment (Signed)
[facility] INPATIENT:  Family/Significant Other Suicide Prevention Education  Suicide Prevention Education:  Education Completed; Mother,  Valeda Malm ph#: [phone]  has been identified by the patient as the family member/significant other with whom the patient will be residing, and identified as the person(s) who will aid the patient in the event of a mental health crisis (suicidal ideations/suicide attempt).  With written consent from the patient, the family member/significant other has been provided the following suicide prevention education, prior to the and/or following the discharge of the patient. Mother was concerned about patient's well-being. She stated that he has been using substances and believes that he will benefit from a program that helps with alcohol/drugs and a community support team. CSW informed mother that his care coordinator, Shelby Dubin and I have worked together to start his aftercare plans with [facility] and their C[facility]. Mother feels relieved and is willing to take pt back at discharge.  The suicide prevention education provided includes the following:  Suicide risk factors  Suicide prevention and interventions  National Suicide Hotline telephone number  [facility] assessment telephone number  [facility] Emergency Assistance 911  [facility] and/or Residential Mobile Crisis Unit telephone number  Request made of family/significant other to:  Remove weapons (e.g., guns, rifles, knives), all items previously/currently identified as safety concern.    Remove drugs/medications (over-the-counter, prescriptions, illicit drugs), all items previously/currently identified as a safety concern.  The family member/significant other verbalizes understanding of the suicide prevention education information provided.  The family member/significant other agrees to remove the items of safety concern listed above.  Lynden Oxford, MSW, LCSW-A 07/16/2016, 10:42  AM

## 2016-07-16 NOTE — [facility] Group Notes (Signed)
[facility] Group Notes:  (Nursing/MHT/Case Management/Adjunct)  Date:  07/16/2016  Time:  3:38 PM  Type of Therapy:  Psychoeducational Skills  Participation Level:  Active  Participation Quality:  Appropriate, Sharing and Supportive  Affect:  Appropriate  Cognitive:  Appropriate  Insight:  Appropriate  Engagement in Group:  Engaged and Supportive  Modes of Intervention:  Discussion and Education  Summary of Progress/Problems:  Joshua [name] 07/16/2016, 3:38 PM

## 2016-07-17 NOTE — Progress Notes (Signed)
D: Patient remains pleasant on the unit. Stated he's going to follow up with [facility] and get help with his substance abuse issues. He denies SI/HI/AVH. Denies pain. Attended group and visible in the milieu.  A: Medication given with education. Encouragement provided.  R: Patient has been compliant with medication. He has remained calm and cooperative. Safety maintained with 15 min checks.

## 2016-07-17 NOTE — Progress Notes (Signed)
Patient denies SI/HI, denies A/V hallucinations. Patient verbalizes understanding of discharge instructions, follow up care and prescriptions. Patient given all belongings from  locker. Patient escorted out by staff, transported by family. 

## 2016-07-17 NOTE — Progress Notes (Signed)
Recreation Therapy Notes  Date: 08.02.17 Time: 9:30 am Location: Craft Room  Group Topic: Self-esteem  Goal Area(s) Addresses:  Patient will write at least one positive trait about self. Patient will verbalize benefit of having healthy self-esteem.  Behavioral Response: Attentive, Interactive  Intervention: I Am  Activity: Patients were given a worksheet with the letter I on it and instructed to write as many positive traits about themselves inside the letter.   Education: LRT educated patients on ways they can increase their self-esteem.  Education Outcome: Acknowledges education/In group clarification offered  Clinical Observations/Feedback: Patient left group before it started with Dr. Ardyth Harps. Patient returned to group at approximately 9:53 am. LRT explained activity. Patient wrote positive traits about self. Patient contributed to group discussion by stating it was easy to think of positive traits and why, how it felt to list positive traits, and how he can increase his self-esteem.  Jacquelynn Cree, LRT/CTRS 07/17/2016 1:16 PM

## 2016-07-17 NOTE — Progress Notes (Signed)
  [facility] Adult Case Management Discharge Plan :  Will you be returning to the same living situation after discharge:  Yes,  home with family At discharge, do you have transportation home?: Yes, home with mother. Do you have the ability to pay for your medications: Yes,  mother will pick pt. up  Release of information consent forms completed and in the chart;  Patient's signature needed at discharge.  Patient to Follow up at: Follow-up Information    [facility]. Go on 07/18/2016.   Why:  Please arrive to the walk-in clinic between the hours of 8am for a hospital follow-up for medication management and therapy.  Arrive as early as possible for prompt service.  Please call Unk Pinto at [phone] for questions and assistance. Contact information: [facility] [place] [address] [place] [address] Ph: [phone] Fax: [phone]          Next level of care provider has access to [facility] Link:no  Safety Planning and Suicide Prevention discussed: Yes,  SPE reviewed with pt and mother  Have you used any form of tobacco in the last 30 days? (Cigarettes, Smokeless Tobacco, Cigars, and/or Pipes): Yes  Has patient been referred to the Quitline?: Patient refused referral  Patient has been referred for addiction treatment: Yes - referred to [facility].  Lynden Oxford, MSW, LCSW-A 07/17/2016, 9:44 AM

## 2016-07-17 NOTE — [facility] Group Notes (Signed)
[facility] Group Notes:  (Nursing/MHT/Case Management/Adjunct)  Date:  07/17/2016  Time:  4:48 AM  Type of Therapy:  Group Therapy  Participation Level:  Active  Participation Quality:  Appropriate  Affect:  Appropriate  Cognitive:  Appropriate  Insight:  Appropriate  Engagement in Group:  Engaged  Modes of Intervention:  n/a  Summary of Progress/Problems:  Joshua [name] 07/17/2016, 4:48 AM

## 2016-07-17 NOTE — Tx Team (Signed)
Interdisciplinary Treatment Plan Update (Adult)         Date: 07/17/2016   Time Reviewed: 10:30 AM   Progress in Treatment: Improving Attending groups: Yes  Participating in groups: Yes  Taking medication as prescribed: Yes  Tolerating medication: Yes  Family/Significant other contact made: CSW spoke with mother, Vincente Liberty Patient understands diagnosis: Yes  Discussing patient identified problems/goals with staff: Yes  Medical problems stabilized or resolved: Yes  Denies suicidal/homicidal ideation: Yes  Issues/concerns per patient self-inventory: Yes  Other:   New problem(s) identified: N/A   Discharge Plan or Barriers: see below   Reason for Continuation of Hospitalization:   Depression   Anxiety   Medication Stabilization   Comments: N/A   Estimated length of stay: 3-5 days    Patient is a 25 year old male admitted for homicidal ideation and aggressive behavior. Patient lives in [place], [place]. Patient will benefit from crisis stabilization, medication evaluation, group therapy, and psycho education in addition to case management for discharge planning. Patient and CSW reviewed pt's identified goals and treatment plan. Pt verbalized understanding and agreed to treatment plan.    Review of initial/current patient goals per problem list:  1. Goal(s): Patient will participate in aftercare plan   Met: Yes  Target date: 3-5 days post admission date   As evidenced by: Patient will participate within aftercare plan AEB aftercare provider and housing plan at discharge being identified.   07/12/16: Pt follows up with [facility] - will be referred for [facility]. 07/17/16: Will follow-up with [facility].  2. Goal (s): Patient will exhibit decreased depressive symptoms and suicidal ideations.   Met: Yes  Target date: 3-5 days post admission date   As evidenced by: Patient will utilize self-rating of depression at 3 or below and demonstrate decreased signs of depression or be  deemed stable for discharge by MD.   07/12/16: Pt denies SI at this time. Rates a depression score of a 4 at this time. 07/17/2016: Pt denies SI/HI.  Pt reports he is safe for discharge. Adequate for discharge per MD.   3. Goal(s): Patient will demonstrate decreased signs and symptoms of anxiety.   Met: Yes  Target date: 3-5 days post admission date   As evidenced by: Patient will utilize self-rating of anxiety at 3 or below and demonstrated decreased signs of anxiety, or be deemed stable for discharge by MD   07/12/16: Patient reports an anxiety score of a 4 at this time. 07/17/16: Patient denies anxiety symptoms at this time. Adequate for discharge per MD.   Attendees:  Patient: Joshua [name] Family:  Physician: Merlyn Albert, MD   07/17/2016 10:30 AM  Nursing: Polly Cobia , RN    07/17/2016 10:30 AM  Clinical Social Worker: Glorious Peach, LCSWA8/01/2016 10:30 AM  Recreational Therapist: Everitt Amber              07/17/2016   10:30AM

## 2016-12-22 ENCOUNTER — Emergency Department
Admission: EM | Admit: 2016-12-22 | Discharge: 2016-12-22 | Disposition: A | Discharge status: Home / Self Care | Payer: Medicaid Other | Attending: Emergency Medicine | Admitting: Emergency Medicine

## 2016-12-22 ENCOUNTER — Encounter: Payer: Self-pay | Admitting: Emergency Medicine

## 2016-12-22 DIAGNOSIS — F10120 Alcohol abuse with intoxication, uncomplicated: Secondary | ICD-10-CM | POA: Insufficient documentation

## 2016-12-22 DIAGNOSIS — F17210 Nicotine dependence, cigarettes, uncomplicated: Secondary | ICD-10-CM | POA: Insufficient documentation

## 2016-12-22 DIAGNOSIS — F209 Schizophrenia, unspecified: Secondary | ICD-10-CM | POA: Insufficient documentation

## 2016-12-22 DIAGNOSIS — F10920 Alcohol use, unspecified with intoxication, uncomplicated: Secondary | ICD-10-CM

## 2016-12-22 LAB — COMPREHENSIVE METABOLIC PANEL
ALBUMIN: 4.6 g/dL (ref 3.5–5.0)
ALK PHOS: 79 U/L (ref 38–126)
ALT: 19 U/L (ref 17–63)
ANION GAP: 10 (ref 5–15)
AST: 36 U/L (ref 15–41)
BILIRUBIN TOTAL: 0.5 mg/dL (ref 0.3–1.2)
BUN: 7 mg/dL (ref 6–20)
CALCIUM: 8.9 mg/dL (ref 8.9–10.3)
CO2: 26 mmol/L (ref 22–32)
CREATININE: 0.87 mg/dL (ref 0.61–1.24)
Chloride: 107 mmol/L (ref 101–111)
GFR calc Af Amer: >60 mL/min (ref >60)
GFR calc non Af Amer: >60 mL/min (ref >60)
GLUCOSE: 89 mg/dL (ref 65–99)
Potassium: 4.2 mmol/L (ref 3.5–5.1)
SODIUM: 143 mmol/L (ref 135–145)
TOTAL PROTEIN: 8.1 g/dL (ref 6.5–8.1)

## 2016-12-22 LAB — CBC
HEMATOCRIT: 47.9 % (ref 40.0–52.0)
HEMOGLOBIN: 15.7 g/dL (ref 13.0–18.0)
MCH: 29.4 pg (ref 26.0–34.0)
MCHC: 32.9 g/dL (ref 32.0–36.0)
MCV: 89.6 fL (ref 80.0–100.0)
Platelets: 299 10*3/uL (ref 150–440)
RBC: 5.34 MIL/uL (ref 4.40–5.90)
RDW: 14.3 % (ref 11.5–14.5)
WBC: 6.5 10*3/uL (ref 3.8–10.6)

## 2016-12-22 LAB — SALICYLATE LEVEL: Salicylate Lvl: <7 mg/dL (ref 2.8–30.0)

## 2016-12-22 LAB — ETHANOL: Alcohol, Ethyl (B): 251 mg/dL — ABNORMAL HIGH (ref <5)

## 2016-12-22 LAB — ACETAMINOPHEN LEVEL

## 2016-12-22 NOTE — ED Notes (Signed)
Officer handing patient back belongings from safe. $150 and $7.44 in change, and cell phone

## 2016-12-22 NOTE — ED Triage Notes (Signed)
Arrives with [employer] on IVC papers.  Papers state that patient has history of Bipolar Disorder and schizophrenic, has not been taking his medications, has been drinking alcohol and became emotionally liable and stated that he did not want to live anymore.  Patient denies SI/HI and states "I don't even know why I'm here".

## 2016-12-22 NOTE — ED Notes (Signed)
The patient was dressed out into the required purple scrubs. His belongings were placed into a white patient belongs bag and labeled properly.  His money cell phone and what appears to be a hand rolled smoking device was placed inside a clear unrine cup and all was placed inside a clear specimen bag. He was then walked to room 21 and his belongs and bag with money, cell phone and other was given to the nurse to count and document for safe keeping. RN Drinda ButtsAmber Jones received the bags.  Cloths consisted of one pair black shoes with socks, one pair dark jeans, black shirt, underwear and sports shorts and one jacket. Officer from [employer] present during the dress out period and walking the patient to his room.
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
## 2016-12-22 NOTE — ED Provider Notes (Signed)
Patient has been evaluated by psychiatry and commitment has been overturned. Patient denies suicidal or homicidal ideations. He is able to ambulate without any difficulty.   Emily FilbertJonathan E Williams, MD 12/22/16 [phone]

## 2016-12-22 NOTE — ED Notes (Signed)
Pt given meal tray and juice. 

## 2017-06-16 ENCOUNTER — Emergency Department
Admission: EM | Admit: 2017-06-16 | Discharge: 2017-06-16 | Disposition: A | Discharge status: Home / Self Care | Payer: Self-pay | Attending: Emergency Medicine | Admitting: Emergency Medicine

## 2017-06-16 ENCOUNTER — Emergency Department: Payer: Self-pay

## 2017-06-16 ENCOUNTER — Encounter: Payer: Self-pay | Admitting: Emergency Medicine

## 2017-06-16 DIAGNOSIS — M545 Low back pain, unspecified: Secondary | ICD-10-CM

## 2017-06-16 DIAGNOSIS — F17210 Nicotine dependence, cigarettes, uncomplicated: Secondary | ICD-10-CM | POA: Insufficient documentation

## 2017-06-16 DIAGNOSIS — Z79899 Other long term (current) drug therapy: Secondary | ICD-10-CM | POA: Insufficient documentation
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
## 2017-08-24 ENCOUNTER — Emergency Department
Admission: EM | Admit: 2017-08-24 | Discharge: 2017-08-24 | Disposition: A | Discharge status: Home / Self Care | Payer: Self-pay | Attending: Emergency Medicine | Admitting: Emergency Medicine

## 2017-08-24 DIAGNOSIS — Z113 Encounter for screening for infections with a predominantly sexual mode of transmission: Secondary | ICD-10-CM | POA: Insufficient documentation

## 2017-08-24 NOTE — ED Notes (Signed)
Pt called for room x2. No answer.  
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
## 2017-08-25 ENCOUNTER — Telehealth: Payer: Self-pay | Admitting: Emergency Medicine

## 2017-08-25 NOTE — Telephone Encounter (Signed)
Called patient due to lwot to inquire about condition and follow up plans. I would like to give him options for free std treatment.  I left a message asking him to call me.

## 2017-11-15 ENCOUNTER — Emergency Department
Admission: EM | Admit: 2017-11-15 | Discharge: 2017-11-16 | Disposition: A | Discharge status: Home / Self Care | Payer: Self-pay | Attending: Emergency Medicine | Admitting: Emergency Medicine

## 2017-11-15 DIAGNOSIS — R45851 Suicidal ideations: Secondary | ICD-10-CM | POA: Insufficient documentation

## 2017-11-15 DIAGNOSIS — F17210 Nicotine dependence, cigarettes, uncomplicated: Secondary | ICD-10-CM | POA: Insufficient documentation

## 2017-11-15 DIAGNOSIS — R440 Auditory hallucinations: Secondary | ICD-10-CM | POA: Insufficient documentation

## 2017-11-15 DIAGNOSIS — Z046 Encounter for general psychiatric examination, requested by authority: Secondary | ICD-10-CM | POA: Insufficient documentation

## 2017-11-15 DIAGNOSIS — F10929 Alcohol use, unspecified with intoxication, unspecified: Secondary | ICD-10-CM | POA: Insufficient documentation

## 2017-11-15 LAB — COMPREHENSIVE METABOLIC PANEL
ALBUMIN: 4.9 g/dL (ref 3.5–5.0)
ALT: 18 U/L (ref 17–63)
AST: 36 U/L (ref 15–41)
Alkaline Phosphatase: 73 U/L (ref 38–126)
Anion gap: 12 (ref 5–15)
BUN: 8 mg/dL (ref 6–20)
CHLORIDE: 106 mmol/L (ref 101–111)
CO2: 24 mmol/L (ref 22–32)
CREATININE: 1.08 mg/dL (ref 0.61–1.24)
Calcium: 9.1 mg/dL (ref 8.9–10.3)
GFR calc Af Amer: >60 mL/min (ref >60)
GFR calc non Af Amer: >60 mL/min (ref >60)
GLUCOSE: 86 mg/dL (ref 65–99)
POTASSIUM: 3.5 mmol/L (ref 3.5–5.1)
Sodium: 142 mmol/L (ref 135–145)
Total Bilirubin: 0.5 mg/dL (ref 0.3–1.2)
Total Protein: 7.7 g/dL (ref 6.5–8.1)

## 2017-11-15 LAB — CBC
HEMATOCRIT: 46.1 % (ref 40.0–52.0)
HEMOGLOBIN: 15.1 g/dL (ref 13.0–18.0)
MCH: 29.6 pg (ref 26.0–34.0)
MCHC: 32.7 g/dL (ref 32.0–36.0)
MCV: 90.5 fL (ref 80.0–100.0)
Platelets: 232 10*3/uL (ref 150–440)
RBC: 5.09 MIL/uL (ref 4.40–5.90)
RDW: 13.1 % (ref 11.5–14.5)
WBC: 9.6 10*3/uL (ref 3.8–10.6)

## 2017-11-15 LAB — SALICYLATE LEVEL: Salicylate Lvl: <7 mg/dL (ref 2.8–30.0)

## 2017-11-15 LAB — ACETAMINOPHEN LEVEL

## 2017-11-15 LAB — ETHANOL: Alcohol, Ethyl (B): 357 mg/dL (ref <10)

## 2017-11-15 MED ORDER — DIPHENHYDRAMINE HCL 50 MG/ML IJ SOLN
25.0000 mg | Freq: Once | INTRAMUSCULAR | Status: AC
  Administered 2017-11-15: 25 mg via INTRAVENOUS

## 2017-11-15 MED ORDER — LORAZEPAM 2 MG/ML IJ SOLN
INTRAMUSCULAR | Status: AC
  Administered 2017-11-15: 2 mg via INTRAVENOUS
  Filled 2017-11-15: qty 1

## 2017-11-15 MED ORDER — DIPHENHYDRAMINE HCL 50 MG/ML IJ SOLN
INTRAMUSCULAR | Status: AC
  Filled 2017-11-15: qty 1

## 2017-11-15 MED ORDER — LORAZEPAM 2 MG/ML IJ SOLN
2.0000 mg | Freq: Once | INTRAMUSCULAR | Status: AC
  Administered 2017-11-15: 2 mg via INTRAVENOUS

## 2017-11-15 MED ORDER — HALOPERIDOL LACTATE 5 MG/ML IJ SOLN
INTRAMUSCULAR | Status: AC
  Filled 2017-11-15: qty 1

## 2017-11-15 MED ORDER — HALOPERIDOL LACTATE 5 MG/ML IJ SOLN
5.0000 mg | Freq: Once | INTRAMUSCULAR | Status: AC
  Administered 2017-11-15: 5 mg via INTRAVENOUS

## 2017-11-15 NOTE — ED Notes (Signed)
[employer] removed pt handcuffs. Pt lying on bed, rolling around on stretcher. Raising arms. Sitting up attempting to get up from bed.

## 2017-11-15 NOTE — ED Notes (Addendum)

## 2017-11-15 NOTE — ED Notes (Signed)
BEHAVIORAL HEALTH ROUNDING Patient sleeping: Yes.   Patient alert and oriented: not applicable SLEEPING Behavior appropriate: Yes.  ; If no, describe: SLEEPING Nutrition and fluids offered: No SLEEPING Toileting and hygiene offered: NoSLEEPING Sitter present: not applicable, Q 15 min safety rounds and observation. Law enforcement present: Yes ODS 
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
## 2017-11-15 NOTE — ED Provider Notes (Signed)
[facility] Emergency Department Provider Note  ____________________________________________  Time seen: Approximately 6:42 PM  I have reviewed the triage vital signs and the nursing notes.   HISTORY  Chief Complaint Alcohol Intoxication  Level 5 Caveat: Portions of the History and Physical are unable to be obtained due to patient being a poor historian   HPI Joshua [name] is a 26 y.o. male with a history of psychiatric disease who was found outside of a [employer] today intoxicated, screaming obscenities. He is brought to the ED under IVC for evaluation because he reportedly could not stand up on scene. To me, patient denies any complaints. Denies pain. Admits to some alcohol intake today but denies smoking or any drug use. Denies being prescribed any medications or having any medical history.  Patient denies SI HI or hallucinations, but per the police officers at the bedside, the patient made comments to them and EMS that he wanted to kill himself and that he was hearing voices. Unclear onset, but symptoms have been constant since the patient came in contact with first responders today. No apparent aggravating or alleviating factors. Currently the symptoms appear to be severe.      Past Medical History:  Diagnosis Date  . Bipolar 1 disorder (HCC)   . Schizophrenia [facility](HCC)      Patient Active Problem List   Diagnosis Date Noted  . Schizoaffective disorder (HCC) 07/16/2016  . Cannabis use disorder, moderate, dependence (HCC) 07/12/2016  . Tobacco use disorder 07/12/2016  . Noncompliance 07/11/2016  . Alcohol use disorder, moderate, dependence (HCC)      History reviewed. No pertinent surgical history.   Prior to Admission medications   Medication Sig Start Date End Date Taking? Authorizing Provider  ARIPiprazole (ABILIFY) 30 MG tablet Take 1 tablet (30 mg total) by mouth daily. 07/16/16   Jimmy FootmanHernandez-Gonzalez, Andrea, MD  ARIPiprazole ER 400 MG  SUSR Inject 400 mg into the muscle every 28 (twenty-eight) days. 07/16/16   Jimmy FootmanHernandez-Gonzalez, Andrea, MD     Allergies Patient has no known allergies.   No family history on file.  Social History Social History   Tobacco Use  . Smoking status: Current Every Day Smoker    Packs/day: 1.00    Years: 5.00    Pack years: 5.00    Types: Cigarettes  . Smokeless tobacco: Never Used  Substance Use Topics  . Alcohol use: Yes    Alcohol/week: 12.0 oz    Types: 20 Cans of beer per week    Comment: 12/22/16- states he drank "a lot last night"  . Drug use: Yes    Types: Marijuana    Review of Systems Unable to reliably obtained due to the patient being uncooperative and a poor historian.  ____________________________________________   PHYSICAL EXAM:  VITAL SIGNS: ED Triage Vitals  Enc Vitals Group     BP 11/15/17 1741 (!) 135/95     Pulse --      Resp 11/15/17 1741 18     Temp 11/15/17 1741 98.2 F (36.8 C)     Temp Source 11/15/17 1741 Oral     SpO2 11/15/17 1741 100 %     Weight 11/15/17 1742 140 lb (63.5 kg)     Height 11/15/17 1742 5\' 10"  (1.778 m)     Head Circumference --      Peak Flow --      Pain Score --      Pain Loc --      Pain Edu? --  Excl. in GC? --     Vital signs reviewed, nursing assessments reviewed.   Constitutional:   Alert and oriented To person and place.not in distress  Eyes:   No scleral icterus.  EOMI. No nystagmus. No conjunctival pallor. PERRL. ENT   Head:   Normocephalic and atraumatic.   Nose:   No congestion/rhinnorhea.    Mouth/Throat:   MMM, no pharyngeal erythema. No peritonsillar mass.    Neck:   No meningismus. Full ROM.No midline tenderness  Hematological/Lymphatic/Immunilogical:   No cervical lymphadenopathy. Cardiovascular:   RRR. Symmetric bilateral radial and DP pulses.  No murmurs.  Respiratory:   Normal respiratory effort without tachypnea/retractions. Breath sounds are clear and equal bilaterally. No  wheezes/rales/rhonchi. Gastrointestinal:   Soft and nontender. Non distended. There is no CVA tenderness.  No rebound, rigidity, or guarding. Genitourinary:   deferred Musculoskeletal:   Normal range of motion in all extremities. No joint effusions.  No lower extremity tenderness.  No edema. Neurologic:   Normal speech and language.  Motor grossly intact. No gross focal neurologic deficits are appreciated.  Skin:    Skin is warm, dry and intact. No rash noted.  No petechiae, purpura, or bullae.Small scattered abrasions on the hands  ____________________________________________    LABS (pertinent positives/negatives) (all labs ordered are listed, but only abnormal results are displayed) Labs Reviewed  COMPREHENSIVE METABOLIC PANEL  CBC  ETHANOL  URINE DRUG SCREEN, QUALITATIVE ([facility] ONLY)  ACETAMINOPHEN LEVEL  SALICYLATE LEVEL   ____________________________________________   EKG  Interpreted by me Sinus rhythm rate of 80, normal axis and intervals. Normal QRS with lateral Q waves suggestive of prior infarct, normal ST segments and T waves. No acute ischemic changes.  ____________________________________________    RADIOLOGY  No results found.  ____________________________________________   PROCEDURES Procedures  ____________________________________________     CLINICAL IMPRESSION / ASSESSMENT AND PLAN / ED COURSE  Pertinent labs & imaging results that were available during my care of the patient were reviewed by me and considered in my medical decision making (see chart for details).     Clinical Course as of Nov 15 1841  Sat Nov 15, 2017  1808 Intoxicated, not forthcoming with history. Will need to give haldol and benadryl to calm patient and maintain his safety while waiting for labs and sobriety.  [PS]    Clinical Course User Index [PS] Sharman CheekStafford, Honor Fairbank, MD     ____________________________________________   FINAL CLINICAL IMPRESSION(S) / ED  DIAGNOSES    Final diagnoses:  Alcoholic intoxication with complication (HCC)  Suicidal ideation      This SmartLink is deprecated. Use AVSMEDLIST instead to display the medication list for a patient.   Portions of this note were generated with dragon dictation software. Dictation errors may occur despite best attempts at proofreading.    Sharman CheekStafford, Ghazi Rumpf, MD 11/15/17 [phone]

## 2017-11-15 NOTE — ED Notes (Signed)
Date and time results received: 11/15/17 1900   Test: ETOH Critical Value: 357 Name of Provider Notified: Dr. Scotty CourtStafford

## 2017-11-15 NOTE — ED Notes (Addendum)
BEHAVIORAL HEALTH ROUNDING Patient sleeping: Yes.   Patient alert and oriented: not applicable SLEEPING Behavior appropriate: Yes.  ; If no, describe: SLEEPING Nutrition and fluids offered: No SLEEPING Toileting and hygiene offered: NoSLEEPING Sitter present: not applicable, Q 15 min safety rounds and observation. Law enforcement present: Yes ODS 
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
## 2017-11-16 NOTE — ED Notes (Signed)
BEHAVIORAL HEALTH ROUNDING Patient sleeping: Yes.   Patient alert and oriented: not applicable SLEEPING Behavior appropriate: Yes.  ; If no, describe: SLEEPING Nutrition and fluids offered: No SLEEPING Toileting and hygiene offered: NoSLEEPING Sitter present: not applicable, Q 15 min safety rounds and observation. Law enforcement present: Yes ODS 

## 2017-11-16 NOTE — ED Provider Notes (Signed)
-----------------------------------------   6:00 AM on 11/16/2017 -----------------------------------------   Blood pressure 98/68, temperature 98.2 F (36.8 C), temperature source Oral, resp. rate 18, height 5\' 10"  (1.778 m), weight 63.5 kg (140 lb), SpO2 100 %.  The patient had no acute events since last update.  Sleeping at this time.  [facility] once patient is awake and able to participate in interview.  Disposition is pending Psychiatry/Behavioral Medicine team recommendations.     Irean HongSung, Saurav Crumble J, MD 11/16/17 0600

## 2017-11-16 NOTE — Discharge Instructions (Signed)
You have been seen in the Emergency Department (ED) today for substance abuse.  You have been evaluated by the behavioral medicine specialists and are being discharged to [facility] ([facility]).  Please return to the ED immediately if you have ANY thoughts of hurting yourself or anyone else, so that we may help you.  Please avoid alcohol and drug use.  Follow up with your doctor and/or therapist as soon as possible regarding today's ED  visit.   Please follow up any other recommendations and clinic appointments provided by the psychiatry team that saw you in the Emergency Department.  No driving today or while taking or using alcohol.

## 2017-11-16 NOTE — ED Notes (Signed)
Pt asking about glasses, this tech does not see any documentation of pf having glasses upon arrival. Pt also stating "how long will I be here, I am not suicidal I was just drunk." Informed pt that I would get RN that she would probably have a better answer for him. Pt back to rm at this time. Katie,RN notified of this.

## 2017-11-16 NOTE — ED Notes (Signed)
Report given to SOC 

## 2017-11-16 NOTE — ED Notes (Signed)
SOC called in, computer placed in room.

## 2017-11-16 NOTE — ED Provider Notes (Signed)
Patient is awake and alert in no distress.  Appears clinically sober at this time.'s been seen and evaluated by psychiatry who recommend discharge and have rescinded his IVC.  Patient will be discharged at this time, discussed and recommended follow-up with residential treatment services.  He will be calling for a ride home.  Understands not to drive today or while using alcohol   Sharyn CreamerQuale, Sivan Quast, MD 11/16/17 [phone]

## 2017-11-16 NOTE — ED Notes (Signed)
Pt given breakfast tray, a sprite and a pair of sock.

## 2017-11-16 NOTE — ED Notes (Signed)
Patient calling for a ride home.

## 2017-11-16 NOTE — ED Notes (Signed)
SOC placed in rm. 
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
## 2018-03-12 ENCOUNTER — Encounter: Payer: Self-pay | Admitting: Psychiatry

## 2018-03-12 ENCOUNTER — Inpatient Hospital Stay
Admission: AD | Admit: 2018-03-12 | Discharge: 2018-03-16 | DRG: 885 | Disposition: A | Discharge status: Home / Self Care | Payer: No Typology Code available for payment source | Attending: Psychiatry | Admitting: Psychiatry

## 2018-03-12 ENCOUNTER — Other Ambulatory Visit: Payer: Self-pay

## 2018-03-12 ENCOUNTER — Emergency Department
Admission: EM | Admit: 2018-03-12 | Discharge: 2018-03-12 | Disposition: A | Discharge status: Home / Self Care | Payer: No Typology Code available for payment source | Source: Home / Self Care | Attending: Emergency Medicine | Admitting: Emergency Medicine

## 2018-03-12 DIAGNOSIS — Z814 Family history of other substance abuse and dependence: Secondary | ICD-10-CM

## 2018-03-12 DIAGNOSIS — F10920 Alcohol use, unspecified with intoxication, uncomplicated: Secondary | ICD-10-CM

## 2018-03-12 DIAGNOSIS — F250 Schizoaffective disorder, bipolar type: Secondary | ICD-10-CM | POA: Diagnosis present

## 2018-03-12 DIAGNOSIS — F17210 Nicotine dependence, cigarettes, uncomplicated: Secondary | ICD-10-CM | POA: Diagnosis present

## 2018-03-12 DIAGNOSIS — Z91199 Patient's noncompliance with other medical treatment and regimen due to unspecified reason: Secondary | ICD-10-CM

## 2018-03-12 DIAGNOSIS — Z79899 Other long term (current) drug therapy: Secondary | ICD-10-CM

## 2018-03-12 DIAGNOSIS — Y908 Blood alcohol level of 240 mg/100 ml or more: Secondary | ICD-10-CM | POA: Diagnosis present

## 2018-03-12 DIAGNOSIS — F10229 Alcohol dependence with intoxication, unspecified: Secondary | ICD-10-CM | POA: Diagnosis present

## 2018-03-12 DIAGNOSIS — F209 Schizophrenia, unspecified: Secondary | ICD-10-CM | POA: Diagnosis present

## 2018-03-12 DIAGNOSIS — F1020 Alcohol dependence, uncomplicated: Secondary | ICD-10-CM | POA: Diagnosis present

## 2018-03-12 DIAGNOSIS — F1210 Cannabis abuse, uncomplicated: Secondary | ICD-10-CM

## 2018-03-12 DIAGNOSIS — Z818 Family history of other mental and behavioral disorders: Secondary | ICD-10-CM

## 2018-03-12 DIAGNOSIS — F1220 Cannabis dependence, uncomplicated: Secondary | ICD-10-CM | POA: Diagnosis present

## 2018-03-12 DIAGNOSIS — Z915 Personal history of self-harm: Secondary | ICD-10-CM

## 2018-03-12 DIAGNOSIS — Z9119 Patient's noncompliance with other medical treatment and regimen: Secondary | ICD-10-CM

## 2018-03-12 DIAGNOSIS — F17200 Nicotine dependence, unspecified, uncomplicated: Secondary | ICD-10-CM | POA: Diagnosis present

## 2018-03-12 DIAGNOSIS — F3162 Bipolar disorder, current episode mixed, moderate: Secondary | ICD-10-CM

## 2018-03-12 LAB — COMPREHENSIVE METABOLIC PANEL
ALBUMIN: 4.6 g/dL (ref 3.5–5.0)
ALT: 43 U/L (ref 17–63)
AST: 64 U/L — AB (ref 15–41)
Alkaline Phosphatase: 54 U/L (ref 38–126)
Anion gap: 13 (ref 5–15)
BUN: 8 mg/dL (ref 6–20)
CHLORIDE: 103 mmol/L (ref 101–111)
CO2: 24 mmol/L (ref 22–32)
Calcium: 8.7 mg/dL — ABNORMAL LOW (ref 8.9–10.3)
Creatinine, Ser: 0.67 mg/dL (ref 0.61–1.24)
GFR calc Af Amer: >60 mL/min (ref >60)
GFR calc non Af Amer: >60 mL/min (ref >60)
GLUCOSE: 82 mg/dL (ref 65–99)
Potassium: 3.9 mmol/L (ref 3.5–5.1)
SODIUM: 140 mmol/L (ref 135–145)
Total Bilirubin: 0.7 mg/dL (ref 0.3–1.2)
Total Protein: 7.3 g/dL (ref 6.5–8.1)

## 2018-03-12 LAB — URINE DRUG SCREEN, QUALITATIVE (ARMC ONLY)
Amphetamines, Ur Screen: NOT DETECTED
BARBITURATES, UR SCREEN: NOT DETECTED
Benzodiazepine, Ur Scrn: NOT DETECTED
COCAINE METABOLITE, UR ~~LOC~~: NOT DETECTED
Cannabinoid 50 Ng, Ur ~~LOC~~: POSITIVE — AB
MDMA (ECSTASY) UR SCREEN: NOT DETECTED
METHADONE SCREEN, URINE: NOT DETECTED
Opiate, Ur Screen: NOT DETECTED
Phencyclidine (PCP) Ur S: NOT DETECTED
TRICYCLIC, UR SCREEN: NOT DETECTED

## 2018-03-12 LAB — CBC WITH DIFFERENTIAL/PLATELET
BASOS ABS: 0 10*3/uL (ref 0–0.1)
BASOS PCT: 0 %
EOS PCT: 2 %
Eosinophils Absolute: 0.1 10*3/uL (ref 0–0.7)
HCT: 44.1 % (ref 40.0–52.0)
Hemoglobin: 14.4 g/dL (ref 13.0–18.0)
Lymphocytes Relative: 38 %
Lymphs Abs: 1.8 10*3/uL (ref 1.0–3.6)
MCH: 29.1 pg (ref 26.0–34.0)
MCHC: 32.7 g/dL (ref 32.0–36.0)
MCV: 89 fL (ref 80.0–100.0)
MONO ABS: 0.2 10*3/uL (ref 0.2–1.0)
Monocytes Relative: 5 %
Neutro Abs: 2.6 10*3/uL (ref 1.4–6.5)
Neutrophils Relative %: 55 %
PLATELETS: 241 10*3/uL (ref 150–440)
RBC: 4.96 MIL/uL (ref 4.40–5.90)
RDW: 13.5 % (ref 11.5–14.5)
WBC: 4.7 10*3/uL (ref 3.8–10.6)

## 2018-03-12 LAB — ETHANOL: ALCOHOL ETHYL (B): 308 mg/dL — AB (ref <10)

## 2018-03-12 LAB — SALICYLATE LEVEL: Salicylate Lvl: <7 mg/dL (ref 2.8–30.0)

## 2018-03-12 LAB — ACETAMINOPHEN LEVEL: Acetaminophen (Tylenol), Serum: <10 ug/mL — ABNORMAL LOW (ref 10–30)

## 2018-03-12 MED ORDER — ACETAMINOPHEN 325 MG PO TABS: 650.0000 mg | ORAL_TABLET | Freq: Four times a day (QID) | ORAL | Status: DC | PRN

## 2018-03-12 MED ORDER — ARIPIPRAZOLE 10 MG PO TABS
30.0000 mg | ORAL_TABLET | Freq: Every day | ORAL | Status: DC
  Administered 2018-03-12 – 2018-03-16 (×5): 30 mg via ORAL
  Filled 2018-03-12 (×5): qty 3

## 2018-03-12 MED ORDER — ALUM & MAG HYDROXIDE-SIMETH 200-200-20 MG/5ML PO SUSP
30.0000 mL | ORAL | Status: DC | PRN
  Administered 2018-03-13 – 2018-03-15 (×3): 30 mL via ORAL
  Filled 2018-03-12 (×3): qty 30

## 2018-03-12 MED ORDER — HYDROXYZINE HCL 25 MG PO TABS
25.0000 mg | ORAL_TABLET | Freq: Three times a day (TID) | ORAL | Status: DC | PRN
  Administered 2018-03-12: 25 mg via ORAL
  Filled 2018-03-12: qty 1

## 2018-03-12 MED ORDER — ZIPRASIDONE MESYLATE 20 MG IM SOLR
INTRAMUSCULAR | Status: AC
  Administered 2018-03-12: 10 mg via INTRAMUSCULAR
  Filled 2018-03-12: qty 20

## 2018-03-12 MED ORDER — ZIPRASIDONE MESYLATE 20 MG IM SOLR
10.0000 mg | Freq: Once | INTRAMUSCULAR | Status: AC
  Administered 2018-03-12: 10 mg via INTRAMUSCULAR

## 2018-03-12 MED ORDER — MAGNESIUM HYDROXIDE 400 MG/5ML PO SUSP: 30.0000 mL | Freq: Every day | ORAL | Status: DC | PRN

## 2018-03-12 MED ORDER — LORAZEPAM 2 MG PO TABS: 2.0000 mg | ORAL_TABLET | ORAL | Status: DC | PRN

## 2018-03-12 MED ORDER — CHLORDIAZEPOXIDE HCL 25 MG PO CAPS
25.0000 mg | ORAL_CAPSULE | Freq: Four times a day (QID) | ORAL | Status: DC
  Administered 2018-03-12 – 2018-03-15 (×11): 25 mg via ORAL
  Filled 2018-03-12 (×11): qty 1

## 2018-03-12 MED ORDER — TRAZODONE HCL 100 MG PO TABS
100.0000 mg | ORAL_TABLET | Freq: Every evening | ORAL | Status: DC | PRN
  Administered 2018-03-13 – 2018-03-14 (×2): 100 mg via ORAL
  Filled 2018-03-12: qty 1

## 2018-03-12 MED ORDER — NICOTINE 21 MG/24HR TD PT24
21.0000 mg | MEDICATED_PATCH | Freq: Every day | TRANSDERMAL | Status: DC
  Administered 2018-03-12: 21 mg via TRANSDERMAL
  Filled 2018-03-12: qty 1

## 2018-03-12 MED ORDER — NICOTINE 21 MG/24HR TD PT24
21.0000 mg | MEDICATED_PATCH | Freq: Every day | TRANSDERMAL | Status: DC
  Administered 2018-03-13 – 2018-03-16 (×5): 21 mg via TRANSDERMAL
  Filled 2018-03-12 (×5): qty 1

## 2018-03-12 MED ORDER — ARIPIPRAZOLE ER 400 MG IM SRER
400.0000 mg | INTRAMUSCULAR | Status: DC
  Administered 2018-03-12: 400 mg via INTRAMUSCULAR
  Filled 2018-03-12: qty 2

## 2018-03-12 NOTE — BH Assessment (Signed)
Dr. Jennet MaduroPucilowska recommends patient needs inpatient psychiatric treatment.

## 2018-03-12 NOTE — ED Notes (Signed)
[facility] and [facility] can [facility]/readmit at 2 PM, [facility] Cyril LoosenKinner. Patient will be transferring to room 4. Patient is calm and cooperative, voices understanding of transfer.

## 2018-03-12 NOTE — ED Notes (Signed)
He has ambulated to and from the BR with a steady gait  Continue to monitor

## 2018-03-12 NOTE — ED Notes (Signed)
Report given to SOC MD.  
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
## 2018-03-12 NOTE — [facility] Suicide Risk Assessment (Signed)
[facility]   Nursing information obtained from:    Demographic factors:    Current Mental Status:    Loss Factors:    Historical Factors:    Risk Reduction Factors:     Total Time spent with patient: 1 hour Principal Problem: <principal problem not specified> Diagnosis:   Patient Active Problem List   Diagnosis Date Noted  . Schizoaffective disorder, bipolar type (HCC) [F25.0] 07/16/2016    Priority: High  . Cannabis use disorder, moderate, dependence (HCC) [F12.20] 07/12/2016  . Tobacco use disorder [F17.200] 07/12/2016  . Noncompliance [Z91.19] 07/11/2016  . Alcohol use disorder, moderate, dependence (HCC) [F10.20]    Subjective Data: psychosis  Continued Clinical Symptoms:    The "Alcohol Use Disorders Identification Test", Guidelines for Use in Primary Care, Second Edition.  World Science writerHealth Organization [facility](WHO). Score between 0-7:  no or low risk or alcohol related problems. Score between 8-15:  moderate risk of alcohol related problems. Score between 16-19:  high risk of alcohol related problems. Score 20 or above:  warrants further diagnostic evaluation for alcohol dependence and treatment.   CLINICAL FACTORS:   Alcohol/Substance Abuse/Dependencies Schizophrenia:   Less than 27 years old   Musculoskeletal: Strength & Muscle Tone: within normal limits Gait & Station: normal Patient leans: N/A  Psychiatric Specialty Exam: Physical Exam  Nursing note and vitals reviewed. Psychiatric: His affect is blunt. His speech is slurred. He is withdrawn. Thought content is paranoid. Cognition and memory are normal. He expresses impulsivity.    Review of Systems  Neurological: Negative.   Psychiatric/Behavioral: Positive for substance abuse.  All other systems reviewed and are negative.   There were no vitals taken for this visit.There is no height or weight on file to calculate BMI.  General Appearance: Disheveled  Eye Contact:  Minimal  Speech:   Slurred  Volume:  Decreased  Mood:  Dysphoric  Affect:  Congruent  Thought Process:  Goal Directed and Descriptions of Associations: Intact  Orientation:  Full (Time, Place, and Person)  Thought Content:  Delusions and Paranoid Ideation  Suicidal Thoughts:  No  Homicidal Thoughts:  No  Memory:  Immediate;   Fair Recent;   Fair Remote;   Fair  Judgement:  Poor  Insight:  Lacking  Psychomotor Activity:  Psychomotor Retardation  Concentration:  Concentration: Fair and Attention Span: Fair  Recall:  [employer]Fair  Fund of Knowledge:  Fair  Language:  Fair  Akathisia:  No  Handed:  Right  AIMS (if indicated):     Assets:  Communication Skills Desire for Improvement Housing Physical Health Resilience Social Support  ADL's:  Intact  Cognition:  WNL  Sleep:         COGNITIVE FEATURES THAT CONTRIBUTE TO RISK:  None    SUICIDE RISK:   Moderate:  Frequent suicidal ideation with limited intensity, and duration, some specificity in terms of plans, no associated intent, good self-control, limited dysphoria/symptomatology, some risk factors present, and identifiable protective factors, including available and accessible social support.  PLAN OF CARE: hospital admission, medication management, substance abuse counseling, discharge planning.  [name] is a 27 year old male with a history of schizoaffective disorder, alcoholism, noncompliance admitted for disorganized psychotic behavior.  #Agitation -resolved  #Mood/psychosis -restart Abilify 30 mg daily -Abilify maintena 400 mg monthly  #Alcohol detox -Librium 25 mg TID  #Substance abuse ttreatment -patient minimizes problems and declines treatment  #Metabolic syndrome monitoring -Lipid panel, TSH and HgbA1C are pending -EKG pending  #Disposition -discharge to home -  follow up with [facility]  I certify that inpatient services furnished can reasonably be expected to improve the patient's condition.   Kristine Linea,  MD 03/12/2018, 4:36 PM

## 2018-03-12 NOTE — ED Notes (Signed)
He is awake - sitting on the side of the bed [facility]  NAD observed  Continue to monitor
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
## 2018-03-12 NOTE — ED Notes (Signed)
TTS consulting with him at this time   [facility] due to med noncompliance in more than 12 months, Increased aggression at home and responding to internal stimuli

## 2018-03-12 NOTE — BH Assessment (Signed)
[facility], but pt had just been given Geodon and was unable to participate in the assessment. Attempts will be made later when pt is more coherent.

## 2018-03-12 NOTE — ED Notes (Signed)
Patient dressed out by this Clinical research associatewriter and NT. Belongings placed in bag labeled and put in appropriate area.

## 2018-03-12 NOTE — ED Notes (Signed)
Patient observed lying in bed with eyes closed  Even, unlabored respirations observed   NAD pt appears to be sleeping  I will continue to monitor along with every 15 minute visual observations and ongoing security monitoring    
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
## 2018-03-12 NOTE — ED Notes (Signed)
Patient is currently resting in bed with eyes closed.

## 2018-03-12 NOTE — Tx Team (Signed)
Initial Treatment Plan 03/12/2018 6:09 PM Joshua Rosenthalashawn Bacci [id number]    PATIENT STRESSORS: Financial difficulties Medication change or noncompliance Occupational concerns Substance abuse Other: Unable to accomplish change and control circunstances   PATIENT STRENGTHS: Ability for insight Communication skills Motivation for treatment/growth Physical Health Supportive family/friends   PATIENT IDENTIFIED PROBLEMS: Substance abuse   Noncompliance with medication  Coping with every day life events                 DISCHARGE CRITERIA:  Ability to meet basic life and health needs Adequate post-discharge living arrangements Improved stabilization in mood, thinking, and/or behavior Motivation to continue treatment in a less acute level of care  PRELIMINARY DISCHARGE PLAN: Return to previous living arrangement  PATIENT/FAMILY INVOLVEMENT: This treatment plan has been presented to and reviewed with the patient, Joshua [name].  The patient has been given the opportunity to ask questions and make suggestions.  Rex KrasJoanne  Alvester Eads, RN 03/12/2018, 6:09 PM
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
## 2018-03-12 NOTE — ED Notes (Signed)
BEHAVIORAL HEALTH ROUNDING Patient sleeping: No. Patient alert and oriented: yes Behavior appropriate: Yes.  ; If no, describe:  Nutrition and fluids offered: yes Toileting and hygiene offered: Yes  Sitter present: q15 minute observations and security monitoring Law enforcement present: Yes    

## 2018-03-12 NOTE — ED Notes (Signed)
SOC machine set up in his room - he awakened  - I introduced myself to him and we discussed his plan of care  Breakfast provided along with a cup of water and extra crackers with pb  Pt verbalizes agreement and understanding

## 2018-03-12 NOTE — BH Assessment (Signed)
Attempted to meet with pt for TTS Assessment but pt was incoherent and unable to answer questions/participate due to the dose of Geodon he was given earlier. Will inform AM shift of the need for pt to have his assessment completed once he is awake.

## 2018-03-12 NOTE — ED Notes (Signed)
Patient was not being cooperative with officers and staff. Patient was trying to walk off unit saying he does not belong here. This writer was unable to assess patient.

## 2018-03-12 NOTE — BH Assessment (Addendum)
Patient is to be admitted [facility] BM[facility] by Dr. Jennet MaduroPucilowska.  Attending Physician will be Dr. Jennet MaduroPucilowska.   Patient has been [facility] room [facility] Gwen.   Intake Paper Work has been signed and placed on patient chart.  ER staff is aware of the admission:  Luann, ER Sectary   Dr. Cyril LoosenKinner, ER MD   Amy, Patient's Nurse   Anthony,Patient Access.

## 2018-03-12 NOTE — ED Notes (Signed)
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
## 2018-03-12 NOTE — BH Assessment (Signed)
This writer attempted to contact patient's father and mother at [phone], for collateral. No answer, left HIPPA compliant message for return call.
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
## 2018-03-12 NOTE — ED Notes (Signed)
ED  Is the patient under IVC or is there intent for IVC: Yes.   Is the patient medically cleared: Yes.   Is there vacancy in the ED BHU: Yes.   Is the population mix appropriate for patient: Yes.   Is the patient awaiting placement in inpatient or outpatient setting:  Has the patient had a psychiatric consult: SOC pending Survey of unit performed for contraband, proper placement and condition of furniture, tampering with fixtures in bathroom, shower, and each patient room: Yes.  ; Findings:  APPEARANCE/BEHAVIOR Calm and cooperative NEURO ASSESSMENT Orientation: oriented x3  Denies pain Hallucinations: No.None noted (Hallucinations) denies at present Speech: Normal Gait: normal RESPIRATORY ASSESSMENT Even  Unlabored respirations  CARDIOVASCULAR ASSESSMENT Pulses equal   regular rate  Skin warm and dry   GASTROINTESTINAL ASSESSMENT no GI complaint EXTREMITIES Full ROM  PLAN OF CARE Provide calm/safe environment. Vital signs assessed twice daily. ED BHU Assessment once each 12-hour shift. Collaborate with TTS daily or as condition indicates. Assure the ED provider has rounded once each shift. Provide and encourage hygiene. Provide redirection as needed. Assess for escalating behavior; address immediately and inform ED provider.  Assess family dynamic and appropriateness for visitation as needed: Yes.  ; If necessary, describe findings:  Educate the patient/family about BHU procedures/visitation: Yes.  ; If necessary, describe findings:

## 2018-03-12 NOTE — ED Notes (Addendum)
Lab called critical value of ethanal 308 EDP made aware.

## 2018-03-12 NOTE — ED Notes (Signed)
Patient just stepped out of room, alert and oriented, no signs of distress, q 15 minute checks and law enforcement monitoring for safety.

## 2018-03-12 NOTE — ED Notes (Signed)
[facility]

## 2018-03-13 LAB — LIPID PANEL
CHOLESTEROL: 180 mg/dL (ref 0–200)
HDL: 109 mg/dL (ref >40)
LDL Cholesterol: 47 mg/dL (ref 0–99)
TRIGLYCERIDES: 122 mg/dL (ref <150)
Total CHOL/HDL Ratio: 1.7 RATIO
VLDL: 24 mg/dL (ref 0–40)

## 2018-03-13 LAB — TSH: TSH: 0.575 u[IU]/mL (ref 0.350–4.500)

## 2018-03-13 LAB — HEMOGLOBIN A1C
HEMOGLOBIN A1C: 4.9 % (ref 4.8–5.6)
Mean Plasma Glucose: 93.93 mg/dL

## 2018-03-13 NOTE — Progress Notes (Signed)
[facility] MD Progress Note  03/13/2018 1:46 PM Romin Divita  MRN:  [id number]  Subjective:   [name] met with treatment team today. He feels much better and is no linger irritable and angry. He is still preoccupied with religion. Denies symptoms of alcohol withdrawal. He took Abilify by mouth and Abilify maintena and tells [place] today that it was really helpful in the past. He even believes that he was able to maintain sobriety. He blames his behavior leading to admission on alcohol and is very remorseful. He however is not interested in substance abuse treatment.  Treatment plan. Continue monthly injections of Abilfy maintena and oral Abilify 30 mg for the next 3 weeks for psychosis and mood stabilization.  Social/disposition. Discharge with family. Follow up with [facility].  Principal Problem: Schizoaffective disorder, bipolar type ([place]) Diagnosis:   Patient Active Problem List   Diagnosis Date Noted  . Schizoaffective disorder, bipolar type ([place]) [F25.0] 07/16/2016    Priority: High  . Cannabis use disorder, moderate, dependence ([place]) [F12.20] 07/12/2016  . Tobacco use disorder [F17.200] 07/12/2016  . Noncompliance [Z91.19] 07/11/2016  . Alcohol use disorder, moderate, dependence ([place]) [F10.20]    Total Time spent with patient: 30 minutes  Past Psychiatric History: schizoaffective disorder  Past Medical History:  Past Medical History:  Diagnosis Date  . Bipolar 1 disorder ([place])   . Schizophrenia ([place])    History reviewed. No pertinent surgical history. Family History: History reviewed. No pertinent family history. Family Psychiatric  History: mother with depression Social History:  Social History   Substance and Sexual Activity  Alcohol Use Yes  . Alcohol/week: 12.0 oz  . Types: 20 Cans of beer per week   Comment: 12/22/16- states he drank "a lot last night"     Social History   Substance and Sexual Activity  Drug Use Yes  . Types: Marijuana    Social History    Socioeconomic History  . Marital status: Single    Spouse name: Not on file  . Number of children: Not on file  . Years of education: Not on file  . Highest education level: Not on file  Occupational History  . Not on file  Social Needs  . Financial resource strain: Not on file  . Food insecurity:    Worry: Not on file    Inability: Not on file  . Transportation needs:    Medical: Not on file    Non-medical: Not on file  Tobacco Use  . Smoking status: Current Every Day Smoker    Packs/day: 1.00    Years: 5.00    Pack years: 5.00    Types: Cigarettes  . Smokeless tobacco: Never Used  Substance and Sexual Activity  . Alcohol use: Yes    Alcohol/week: 12.0 oz    Types: 20 Cans of beer per week    Comment: 12/22/16- states he drank "a lot last night"  . Drug use: Yes    Types: Marijuana  . Sexual activity: Yes    Birth control/protection: None  Lifestyle  . Physical activity:    Days per week: Not on file    Minutes per session: Not on file  . Stress: Not on file  Relationships  . Social connections:    Talks on phone: Not on file    Gets together: Not on file    Attends religious service: Not on file    Active member of club or organization: Not on file    Attends meetings of clubs or  organizations: Not on file    Relationship status: Not on file  Other Topics Concern  . Not on file  Social History Narrative  . Not on file   Additional Social History:                         Sleep: Fair  Appetite:  Fair  Current Medications: Current Facility-Administered Medications  Medication Dose Route Frequency Provider Last Rate Last Dose  . acetaminophen (TYLENOL) tablet 650 mg  650 mg Oral Q6H PRN Chantilly Linskey B, MD      . alum & mag hydroxide-simeth (MAALOX/MYLANTA) 200-200-20 MG/5ML suspension 30 mL  30 mL Oral Q4H PRN Izeah Vossler B, MD      . ARIPiprazole (ABILIFY) tablet 30 mg  30 mg Oral Daily Summer Parthasarathy B, MD   30 mg at  03/13/18 0835  . ARIPiprazole ER (ABILIFY MAINTENA) injection 400 mg  400 mg Intramuscular Q28 days Mohamed Portlock B, MD   400 mg at 03/12/18 2046  . chlordiazePOXIDE (LIBRIUM) capsule 25 mg  25 mg Oral QID Aadon Gorelik B, MD   25 mg at 03/13/18 1258  . hydrOXYzine (ATARAX/VISTARIL) tablet 25 mg  25 mg Oral TID PRN Serenity Batley B, MD   25 mg at 03/12/18 1853  . LORazepam (ATIVAN) tablet 2 mg  2 mg Oral Q4H PRN Sayla Golonka B, MD      . magnesium hydroxide (MILK OF MAGNESIA) suspension 30 mL  30 mL Oral Daily PRN Keniel Ralston B, MD      . nicotine (NICODERM CQ - dosed in mg/24 hours) patch 21 mg  21 mg Transdermal Q0600 Satara Virella B, MD   21 mg at 03/13/18 0618  . traZODone (DESYREL) tablet 100 mg  100 mg Oral QHS PRN Kin Galbraith B, MD        Lab Results:  Results for orders placed or performed during the hospital encounter of 03/12/18 (from the past 48 hour(s))  Lipid panel     Status: None   Collection Time: 03/13/18  9:58 AM  Result Value Ref Range   Cholesterol 180 0 - 200 mg/dL   Triglycerides 122 <150 mg/dL   HDL 109 >40 mg/dL   Total CHOL/HDL Ratio 1.7 RATIO   VLDL 24 0 - 40 mg/dL   LDL Cholesterol 47 0 - 99 mg/dL    Comment:        Total Cholesterol/HDL:CHD Risk Coronary Heart Disease Risk Table                     Men   Women  1/2 Average Risk   3.4   3.3  Average Risk       5.0   4.4  2 X Average Risk   9.6   7.1  3 X Average Risk  23.4   11.0        Use the calculated Patient Ratio above and the CHD Risk Table to determine the patient's CHD Risk.        ATP III CLASSIFICATION (LDL):  <100     mg/dL   Optimal  100-129  mg/dL   Near or Above                    Optimal  130-159  mg/dL   Borderline  160-189  mg/dL   High  >190     mg/dL   Very High Performed at [facility],  [place], [place] [address]   TSH     Status: None   Collection Time: 03/13/18  9:58 AM  Result Value Ref Range   TSH  0.575 0.350 - 4.500 uIU/mL    Comment: Performed by a 3rd Generation assay with a functional sensitivity of <=0.01 uIU/mL. Performed at [facility], [address] [place], [place] [address]     Blood Alcohol level:  Lab Results  Component Value Date   ETH 308 [facility]) 03/12/2018   ETH 357 (HH) 00/37/0488    Metabolic Disorder Labs: Lab Results  Component Value Date   HGBA1C 4.7 07/11/2016   Lab Results  Component Value Date   PROLACTIN 5.5 07/11/2016   PROLACTIN 31.8 (H) 03/26/2016   Lab Results  Component Value Date   CHOL 180 03/13/2018   TRIG 122 03/13/2018   HDL 109 03/13/2018   CHOLHDL 1.7 03/13/2018   VLDL 24 03/13/2018   LDLCALC 47 03/13/2018   LDLCALC 70 07/11/2016    Physical Findings: AIMS:  , ,  ,  ,    CIWA:  CIWA-Ar Total: 0 COWS:     Musculoskeletal: Strength & Muscle Tone: within normal limits Gait & Station: normal Patient leans: N/A  Psychiatric Specialty Exam: Physical Exam  Nursing note and vitals reviewed. Psychiatric: He has a normal mood and affect. His speech is normal and behavior is normal. Thought content normal. Cognition and memory are normal. He expresses impulsivity.    Review of Systems  Neurological: Negative.   Psychiatric/Behavioral: Positive for substance abuse.  All other systems reviewed and are negative.   Blood pressure 119/80, pulse 76, temperature 98 F (36.7 C), temperature source Oral, resp. rate 18, height 5' 11.26" (1.81 m), weight 56.7 kg (125 lb), SpO2 100 %.Body mass index is 17.31 kg/m.  General Appearance: Casual  Eye Contact:  Good  Speech:  Clear and Coherent  Volume:  Normal  Mood:  Dysphoric  Affect:  Congruent  Thought Process:  Goal Directed and Descriptions of Associations: Intact  Orientation:  Full (Time, Place, and Person)  Thought Content:  Paranoid Ideation  Suicidal Thoughts:  No  Homicidal Thoughts:  No  Memory:  Immediate;   Fair Recent;   Fair Remote;   Fair  Judgement:   Poor  Insight:  Lacking  Psychomotor Activity:  Normal  Concentration:  Concentration: Fair and Attention Span: Fair  Recall:  [employer] of Knowledge:  Fair  Language:  Fair  Akathisia:  No  Handed:  Right  AIMS (if indicated):     Assets:  Communication Skills Desire for Improvement Financial Resources/Insurance Housing Physical Health Resilience Social Support  ADL's:  Intact  Cognition:  WNL  Sleep:  Number of Hours: 8     Treatment Plan Summary: Daily contact with patient to assess and evaluate symptoms and progress in treatment and Medication management   Mr. Dartt is a 27 year old male with a history of schizoaffective disorder, alcoholism, noncompliance admitted for disorganized psychotic behavior.  #Agitation -resolved  #Mood/psychosis, improving -restart Abilify 30 mg daily -Abilify maintena 400 mg monthly next injection on 04/09/2018  #Alcohol detox -continue Librium 25 mg QID, VS are stable  #Substance abuse treatment -positive for alcohol and cannabis -patient minimizes problems and declines treatment  #Metabolic syndrome monitoring -Lipid panel, TSH and HgbA1C are pending -EKG pending  #Smoking cessation -nicotine patch is available  #Disposition -discharge to home -follow up with [facility]     Orson Slick, MD 03/13/2018, 1:46  PM

## 2018-03-13 NOTE — Progress Notes (Signed)
Received Joshua [name] this AM after breakfast, he was compliant with his medications. He denied all of the psychiatric symptoms this AM including feeling suicidal. His CIWA score was zero this AM. He has been OOB in the milieu most of the morning. No change in his status this PM.
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
## 2018-03-13 NOTE — Plan of Care (Signed)
Patient is Adjusting well in the unit, communicating and socializing appropriately, safety is maintained and 15 minute rounds is in effect, patient denies SI/HI no signs of AVH noted. Problem: Education: Goal: Knowledge of disease or condition will improve Outcome: Progressing Goal: Understanding of discharge needs will improve Outcome: Progressing   Problem: Health Behavior/Discharge Planning: Goal: Ability to identify changes in lifestyle to reduce recurrence of condition will improve Outcome: Progressing Goal: Identification of resources available to assist in meeting health care needs will improve Outcome: Progressing   Problem: Physical Regulation: Goal: Complications related to the disease process, condition or treatment will be avoided or minimized Outcome: Progressing   Problem: Safety: Goal: Ability to remain free from injury will improve Outcome: Progressing

## 2018-03-13 NOTE — [facility] Group Notes (Signed)
LCSW Group Therapy Note  03/13/2018 1:00 pm  Type of Therapy and Topic:  Group Therapy:  Feelings around Relapse and Recovery  Participation Level:  Active   Description of Group:    Patients in this group will discuss emotions they experience before and after a relapse. They will process how experiencing these feelings, or avoidance of experiencing them, relates to having a relapse. Facilitator will guide patients to explore emotions they have related to recovery. Patients will be encouraged to process which emotions are more powerful. They will be guided to discuss the emotional reaction significant others in their lives may have to their relapse or recovery. Patients will be assisted in exploring ways to respond to the emotions of others without this contributing to a relapse.  Therapeutic Goals: 1. Patient will identify two or more emotions that lead to a relapse for them 2. Patient will identify two emotions that result when they relapse 3. Patient will identify two emotions related to recovery 4. Patient will demonstrate ability to communicate their needs through discussion and/or role plays   Summary of Patient Progress: Joshua [name] was able to actively participate in today's group.  [name] was able to identify at least two emotions that he has experienced that has lead to relapses for him.  These emotions included feelings of shame that he was letting himself down and worthless.  [name] was also able to identify at least two emotions that he often experiences when he has been in recovery to include feeling confident in his abilities as well as content.  [name] shared that he often tries to take with his mother about his needs, but often turn to "friends" that are also drinking and doing drugs to "help" him.  Deshawns shared that he knows that he needs to seek support from people that are positive and will help him get to recovery.  [name] shared that he found it beneficial hearing other  group members struggles and things they are doing to help themselves.    Therapeutic Modalities:   Cognitive Behavioral Therapy Solution-Focused Therapy Assertiveness Training Relapse Prevention Therapy   Alease FrameSonya S Elanda Garmany, LCSW 03/13/2018 3:01 PM

## 2018-03-13 NOTE — Progress Notes (Signed)
   03/13/18 1520  Clinical Encounter Type  Visited With Patient  Visit Type Initial (group)   Patient participated in chaplain led nature meditation group.

## 2018-03-13 NOTE — [facility] Group Notes (Signed)
[facility] Group Notes:  (Nursing/MHT/Case Management/Adjunct)  Date:  03/13/2018  Time:  12:25 AM  Type of Therapy:  Group Therapy  Participation Level:  Active  Participation Quality:  Appropriate  Affect:  In and out of group.  Cognitive:  Appropriate  Insight:  Good  Engagement in Group:  Engaged  Modes of Intervention:  Support  Summary of Progress/Problems:  Mayra NeerJackie L Philana Younis 03/13/2018, 12:25 AM

## 2018-03-13 NOTE — Tx Team (Addendum)
Interdisciplinary Treatment and Diagnostic Plan Update  03/13/2018 Time of Session: 10:30 AM Joshua RosenthalDashawn [name] MRN: [id number]  Principal Diagnosis: Schizoaffective disorder, bipolar type (HCC)  Secondary Diagnoses: Principal Problem:   Schizoaffective disorder, bipolar type (HCC) Active Problems:   Alcohol use disorder, moderate, dependence (HCC)   Noncompliance   Cannabis use disorder, moderate, dependence (HCC)   Tobacco use disorder   Current Medications:  Current Facility-Administered Medications  Medication Dose Route Frequency Provider Last Rate Last Dose  . acetaminophen (TYLENOL) tablet 650 mg  650 mg Oral Q6H PRN Pucilowska, Jolanta B, MD      . alum & mag hydroxide-simeth (MAALOX/MYLANTA) 200-200-20 MG/5ML suspension 30 mL  30 mL Oral Q4H PRN Pucilowska, Jolanta B, MD      . ARIPiprazole (ABILIFY) tablet 30 mg  30 mg Oral Daily Pucilowska, Jolanta B, MD   30 mg at 03/13/18 0835  . ARIPiprazole ER (ABILIFY MAINTENA) injection 400 mg  400 mg Intramuscular Q28 days Pucilowska, Jolanta B, MD   400 mg at 03/12/18 2046  . chlordiazePOXIDE (LIBRIUM) capsule 25 mg  25 mg Oral QID Pucilowska, Jolanta B, MD   25 mg at 03/13/18 1258  . hydrOXYzine (ATARAX/VISTARIL) tablet 25 mg  25 mg Oral TID PRN Pucilowska, Jolanta B, MD   25 mg at 03/12/18 1853  . LORazepam (ATIVAN) tablet 2 mg  2 mg Oral Q4H PRN Pucilowska, Jolanta B, MD      . magnesium hydroxide (MILK OF MAGNESIA) suspension 30 mL  30 mL Oral Daily PRN Pucilowska, Jolanta B, MD      . nicotine (NICODERM CQ - dosed in mg/24 hours) patch 21 mg  21 mg Transdermal Q0600 Pucilowska, Jolanta B, MD   21 mg at 03/13/18 0618  . traZODone (DESYREL) tablet 100 mg  100 mg Oral QHS PRN Pucilowska, Jolanta B, MD       PTA Medications: Medications Prior to Admission  Medication Sig Dispense Refill Last Dose  . ARIPiprazole (ABILIFY) 30 MG tablet Take 1 tablet (30 mg total) by mouth daily. (Patient not taking: Reported on 03/12/2018) 30 tablet 0  Not Taking at Unknown time  . ARIPiprazole ER 400 MG SUSR Inject 400 mg into the muscle every 28 (twenty-eight) days. (Patient not taking: Reported on 03/12/2018) 1 each 0 Not Taking at Unknown time    Patient Stressors: Financial difficulties Medication change or noncompliance Occupational concerns Substance abuse Other: Unable to accomplish change and control circunstances  Patient Strengths: Ability for insight Communication skills Motivation for treatment/growth Physical Health Supportive family/friends  Treatment Modalities: Medication Management, Group therapy, Case management,  1 to 1 session with clinician, Psychoeducation, Recreational therapy.   Physician Treatment Plan for Primary Diagnosis: Schizoaffective disorder, bipolar type (HCC) Long Term Goal(s): Improvement in symptoms so as ready for discharge Improvement in symptoms so as ready for discharge   Short Term Goals: Ability to identify changes in lifestyle to reduce recurrence of condition will improve Ability to verbalize feelings will improve Ability to disclose and discuss suicidal ideas Ability to demonstrate self-control will improve Ability to identify and develop effective coping behaviors will improve Ability to maintain clinical measurements within normal limits will improve Compliance with prescribed medications will improve Ability to identify triggers associated with substance abuse/mental health issues will improve Ability to identify changes in lifestyle to reduce recurrence of condition will improve Ability to demonstrate self-control will improve Ability to identify triggers associated with substance abuse/mental health issues will improve  Medication Management: Evaluate patient's response, side effects, and  tolerance of medication regimen.  Therapeutic Interventions: 1 to 1 sessions, Unit Group sessions and Medication administration.  Evaluation of Outcomes: Progressing  Physician Treatment  Plan for Secondary Diagnosis: Principal Problem:   Schizoaffective disorder, bipolar type (HCC) Active Problems:   Alcohol use disorder, moderate, dependence (HCC)   Noncompliance   Cannabis use disorder, moderate, dependence (HCC)   Tobacco use disorder  Long Term Goal(s): Improvement in symptoms so as ready for discharge Improvement in symptoms so as ready for discharge   Short Term Goals: Ability to identify changes in lifestyle to reduce recurrence of condition will improve Ability to verbalize feelings will improve Ability to disclose and discuss suicidal ideas Ability to demonstrate self-control will improve Ability to identify and develop effective coping behaviors will improve Ability to maintain clinical measurements within normal limits will improve Compliance with prescribed medications will improve Ability to identify triggers associated with substance abuse/mental health issues will improve Ability to identify changes in lifestyle to reduce recurrence of condition will improve Ability to demonstrate self-control will improve Ability to identify triggers associated with substance abuse/mental health issues will improve     Medication Management: Evaluate patient's response, side effects, and tolerance of medication regimen.  Therapeutic Interventions: 1 to 1 sessions, Unit Group sessions and Medication administration.  Evaluation of Outcomes: Progressing   RN Treatment Plan for Primary Diagnosis: Schizoaffective disorder, bipolar type (HCC) Long Term Goal(s): Knowledge of disease and therapeutic regimen to maintain health will improve  Short Term Goals: Ability to participate in decision making will improve, Ability to identify and develop effective coping behaviors will improve and Compliance with prescribed medications will improve  Medication Management: RN will administer medications as ordered by provider, will assess and evaluate patient's response and provide  education to patient for prescribed medication. RN will report any adverse and/or side effects to prescribing provider.  Therapeutic Interventions: 1 on 1 counseling sessions, Psychoeducation, Medication administration, Evaluate responses to treatment, Monitor vital signs and CBGs as ordered, Perform/monitor CIWA, COWS, AIMS and Fall Risk screenings as ordered, Perform wound care treatments as ordered.  Evaluation of Outcomes: Progressing   LCSW Treatment Plan for Primary Diagnosis: Schizoaffective disorder, bipolar type (HCC) Long Term Goal(s): Safe transition to appropriate next level of care at discharge, Engage patient in therapeutic group addressing interpersonal concerns.  Short Term Goals: Engage patient in aftercare planning with referrals and resources and Increase skills for wellness and recovery  Therapeutic Interventions: Assess for all discharge needs, 1 to 1 time with Social worker, Explore available resources and support systems, Assess for adequacy in community support network, Educate family and significant other(s) on suicide prevention, Complete Psychosocial Assessment, Interpersonal group therapy.  Evaluation of Outcomes: Progressing   Progress in Treatment: Attending groups: Yes. Participating in groups: Yes. Taking medication as prescribed: Yes. Toleration medication: Yes. Family/Significant other contact made: No, will contact:  CSW will contact pt's mother, Valeda Malm. Patient understands diagnosis: Yes. Discussing patient identified problems/goals with staff: Yes. Medical problems stabilized or resolved: Yes. Denies suicidal/homicidal ideation: Yes. Issues/concerns per patient self-inventory: No. Other: n/a  New problem(s) identified: No, Describe:  No new problems identified.  New Short Term/Long Term Goal(s): Toan stated that his goal for this hospitalization is "get back on my meds"  Discharge Plan or Barriers: Tentative discharge plan is for  Harbert to return to his mother's home with follow-up outpatient services with [facility].  Reason for Continuation of Hospitalization: Depression Medication stabilization  Estimated Length of Stay: 3-5 days  Attendees: Patient: Achillies  Bonsell 03/13/2018 3:37 PM  Physician: Kristine Linea, MD 03/13/2018 3:37 PM  Nursing: Bruce Donath, RN 03/13/2018 3:37 PM  RN Care Manager: 03/13/2018 3:37 PM  Social Worker: Huey Romans, LCSW 03/13/2018 3:37 PM  Recreational Therapist:  03/13/2018 3:37 PM  Other: Heidi Dach, LCSW 03/13/2018 3:37 PM  Other:  03/13/2018 3:37 PM  Other: 03/13/2018 3:37 PM    Scribe for Treatment Team: Alease Frame, LCSW 03/13/2018 3:37 PM

## 2018-03-14 MED ORDER — LOPERAMIDE HCL 2 MG PO CAPS
4.0000 mg | ORAL_CAPSULE | Freq: Once | ORAL | Status: AC
  Administered 2018-03-14: 4 mg via ORAL
  Filled 2018-03-14: qty 2

## 2018-03-14 NOTE — [facility] Group Notes (Signed)
LCSW Group Therapy Note   03/14/2018 1:15pm   Type of Therapy and Topic:  Group Therapy:  Trust and Honesty  Participation Level:  Active  Description of Group:    In this group patients will be asked to explore the value of being honest.  Patients will be guided to discuss their thoughts, feelings, and behaviors related to honesty and trusting in others. Patients will process together how trust and honesty relate to forming relationships with peers, family members, and self. Each patient will be challenged to identify and express feelings of being vulnerable. Patients will discuss reasons why people are dishonest and identify alternative outcomes if one was truthful (to self or others). This group will be process-oriented, with patients participating in exploration of their own experiences, giving and receiving support, and processing challenge from other group members.   Therapeutic Goals: 1. Patient will identify why honesty is important to relationships and how honesty overall affects relationships.  2. Patient will identify a situation where they lied or were lied too and the  feelings, thought process, and behaviors surrounding the situation 3. Patient will identify the meaning of being vulnerable, how that feels, and how that correlates to being honest with self and others. 4. Patient will identify situations where they could have told the truth, but instead lied and explain reasons of dishonesty.   Summary of Patient Progress Patient stated he feels "wonderful today". Patient discussed thoughts, feelings, and behaviors related to honesty and trusting in others. The pt was able to process how trust and honesty relate to forming relationships with peers, family members, and self. He shared with the group "no one understand when you start lying to everyone, then you start lying to yourself and that leads to drinking, then people are disappointment in him". Patients participated in exploration  of their own experiences, giving and receiving support, and processing challenge from other group members.     Therapeutic Modalities:   Cognitive Behavioral Therapy Solution Focused Therapy Motivational Interviewing Brief Therapy  Joshua [name]  CUEBAS-COLON, LCSW 03/14/2018 12:55 PM

## 2018-03-14 NOTE — [facility] Group Notes (Signed)
[facility] Group Notes:  (Nursing/MHT/Case Management/Adjunct)  Date:  03/14/2018  Time:  9:59 PM  Type of Therapy:  Group Therapy  Participation Level:  Active  Participation Quality:  Appropriate  Affect:  Appropriate  Cognitive:  Alert  Insight:  Appropriate  Engagement in Group:  Engaged  Modes of Intervention:  Support  Summary of Progress/Problems:  Mayra NeerJackie L Chevy Virgo 03/14/2018, 9:59 PM

## 2018-03-14 NOTE — Plan of Care (Signed)
Pt has been calm and cooperative. Pt is medication compliant. No s/sx of distress. Pt denies ah/vh/si/hi at this time. Pt CIWA is 0. Remains on Q 15 mins safety check. Will cont to monitor.  Problem: Education: Goal: Knowledge of disease or condition will improve Outcome: Progressing Goal: Understanding of discharge needs will improve Outcome: Progressing   Problem: Health Behavior/Discharge Planning: Goal: Ability to identify changes in lifestyle to reduce recurrence of condition will improve Outcome: Progressing Goal: Identification of resources available to assist in meeting health care needs will improve Outcome: Progressing   Problem: Physical Regulation: Goal: Complications related to the disease process, condition or treatment will be avoided or minimized Outcome: Progressing   Problem: Safety: Goal: Ability to remain free from injury will improve Outcome: Progressing   Problem: Education: Goal: Knowledge of General Education information will improve Outcome: Progressing   Problem: Health Behavior/Discharge Planning: Goal: Ability to manage health-related needs will improve Outcome: Progressing   Problem: Clinical Measurements: Goal: Ability to maintain clinical measurements within normal limits will improve Outcome: Progressing Goal: Will remain free from infection Outcome: Progressing Goal: Diagnostic test results will improve Outcome: Progressing Goal: Respiratory complications will improve Outcome: Progressing Goal: Cardiovascular complication will be avoided Outcome: Progressing   Problem: Activity: Goal: Risk for activity intolerance will decrease Outcome: Progressing   Problem: Nutrition: Goal: Adequate nutrition will be maintained Outcome: Progressing   Problem: Coping: Goal: Level of anxiety will decrease Outcome: Progressing   Problem: Elimination: Goal: Will not experience complications related to bowel motility Outcome: Progressing Goal: Will  not experience complications related to urinary retention Outcome: Progressing   Problem: Pain Managment: Goal: General experience of comfort will improve Outcome: Progressing   Problem: Safety: Goal: Ability to remain free from injury will improve Outcome: Progressing   Problem: Skin Integrity: Goal: Risk for impaired skin integrity will decrease Outcome: Progressing

## 2018-03-14 NOTE — Plan of Care (Signed)
Patient A&O x 4. Pleasant and cooperative in the milieu. Compliant with medications and meals. Denies SI/HI/AVH and pain. Patient did however complain of abdominal upset. "It sort of feels like diarrhea but it's not actually diarrhea." Imodium 4 mg administered per orders. Milieu remains safe with q 15 minute safety checks.

## 2018-03-14 NOTE — Progress Notes (Signed)
[facility] MD Progress Note  03/14/2018 6:16 PM Joshua [name]  MRN:  [id number]  Subjective:   Joshua [name] has no complaints except diarrhea. He is no longer psychotic. No longer preoccupied with religion and does not question Muslim faith. He spoke with his mother, appologized and is allowed to return to home next week. He agrees to [facility] IOP participation.   Treatment plan. We will continue oral and injectable Abilify.  Social/disposition. Discharge to home. Follow up with [facility].  Principal Problem: Schizoaffective disorder, bipolar type (HCC) Diagnosis:   Patient Active Problem List   Diagnosis Date Noted  . Schizoaffective disorder, bipolar type (HCC) [F25.0] 07/16/2016    Priority: High  . Cannabis use disorder, moderate, dependence (HCC) [F12.20] 07/12/2016  . Tobacco use disorder [F17.200] 07/12/2016  . Noncompliance [Z91.19] 07/11/2016  . Alcohol use disorder, moderate, dependence (HCC) [F10.20]    Total Time spent with patient: 15 minutes  Past Psychiatric History: schizophrenia  Past Medical History:  Past Medical History:  Diagnosis Date  . Bipolar 1 disorder (HCC)   . Schizophrenia (HCC)    History reviewed. No pertinent surgical history. Family History: History reviewed. No pertinent family history. Family Psychiatric  History: none reported Social History:  Social History   Substance and Sexual Activity  Alcohol Use Yes  . Alcohol/week: 12.0 oz  . Types: 20 Cans of beer per week   Comment: 12/22/16- states he drank "a lot last night"     Social History   Substance and Sexual Activity  Drug Use Yes  . Types: Marijuana    Social History   Socioeconomic History  . Marital status: Single    Spouse name: Not on file  . Number of children: Not on file  . Years of education: Not on file  . Highest education level: Not on file  Occupational History  . Not on file  Social Needs  . Financial resource strain: Not on file  . Food insecurity:    Worry: Not on file     Inability: Not on file  . Transportation needs:    Medical: Not on file    Non-medical: Not on file  Tobacco Use  . Smoking status: Current Every Day Smoker    Packs/day: 1.00    Years: 5.00    Pack years: 5.00    Types: Cigarettes  . Smokeless tobacco: Never Used  Substance and Sexual Activity  . Alcohol use: Yes    Alcohol/week: 12.0 oz    Types: 20 Cans of beer per week    Comment: 12/22/16- states he drank "a lot last night"  . Drug use: Yes    Types: Marijuana  . Sexual activity: Yes    Birth control/protection: None  Lifestyle  . Physical activity:    Days per week: Not on file    Minutes per session: Not on file  . Stress: Not on file  Relationships  . Social connections:    Talks on phone: Not on file    Gets together: Not on file    Attends religious service: Not on file    Active member of club or organization: Not on file    Attends meetings of clubs or organizations: Not on file    Relationship status: Not on file  Other Topics Concern  . Not on file  Social History Narrative  . Not on file   Additional Social History:  Sleep: Fair  Appetite:  Fair  Current Medications: Current Facility-Administered Medications  Medication Dose Route Frequency Provider Last Rate Last Dose  . acetaminophen (TYLENOL) tablet 650 mg  650 mg Oral Q6H PRN Emmaleigh Longo B, MD      . alum & mag hydroxide-simeth (MAALOX/MYLANTA) 200-200-20 MG/5ML suspension 30 mL  30 mL Oral Q4H PRN Veda Arrellano B, MD   30 mL at 03/13/18 2121  . ARIPiprazole (ABILIFY) tablet 30 mg  30 mg Oral Daily Irva Loser B, MD   30 mg at 03/14/18 0834  . ARIPiprazole ER (ABILIFY MAINTENA) injection 400 mg  400 mg Intramuscular Q28 days Hilda Rynders B, MD   400 mg at 03/12/18 2046  . chlordiazePOXIDE (LIBRIUM) capsule 25 mg  25 mg Oral QID Zykiria Bruening B, MD   25 mg at 03/14/18 1619  . hydrOXYzine (ATARAX/VISTARIL) tablet 25 mg  25 mg  Oral TID PRN Coye Dawood B, MD   25 mg at 03/12/18 1853  . LORazepam (ATIVAN) tablet 2 mg  2 mg Oral Q4H PRN Alease Fait B, MD      . magnesium hydroxide (MILK OF MAGNESIA) suspension 30 mL  30 mL Oral Daily PRN Draydon Clairmont B, MD      . nicotine (NICODERM CQ - dosed in mg/24 hours) patch 21 mg  21 mg Transdermal Q0600 Kassius Battiste B, MD   21 mg at 03/14/18 0834  . traZODone (DESYREL) tablet 100 mg  100 mg Oral QHS PRN Hamad Whyte B, MD   100 mg at 03/13/18 2121    Lab Results:  Results for orders placed or performed during the hospital encounter of 03/12/18 (from the past 48 hour(s))  Hemoglobin A1c     Status: None   Collection Time: 03/13/18  9:58 AM  Result Value Ref Range   Hgb A1c MFr Bld 4.9 4.8 - 5.6 %    Comment: (NOTE) Pre diabetes:          5.7%-6.4% Diabetes:              >6.4% Glycemic control for   <7.0% adults with diabetes    Mean Plasma Glucose 93.93 mg/dL    Comment: Performed at [facility], [address], [place], [place] [address]  Lipid panel     Status: None   Collection Time: 03/13/18  9:58 AM  Result Value Ref Range   Cholesterol 180 0 - 200 mg/dL   Triglycerides 409 <811 mg/dL   HDL 914 >78 mg/dL   Total CHOL/HDL Ratio 1.7 RATIO   VLDL 24 0 - 40 mg/dL   LDL Cholesterol 47 0 - 99 mg/dL    Comment:        Total Cholesterol/HDL:CHD Risk Coronary Heart Disease Risk Table                     Men   Women  1/2 Average Risk   3.4   3.3  Average Risk       5.0   4.4  2 X Average Risk   9.6   7.1  3 X Average Risk  23.4   11.0        Use the calculated Patient Ratio above and the CHD Risk Table to determine the patient's CHD Risk.        ATP III CLASSIFICATION (LDL):  <100     mg/dL   Optimal  295-621  mg/dL   Near or Above  Optimal  130-159  mg/dL   Borderline  960-454  mg/dL   High  >098     mg/dL   Very High Performed at [facility], [address] [place], [place]  [address]   TSH     Status: None   Collection Time: 03/13/18  9:58 AM  Result Value Ref Range   TSH 0.575 0.350 - 4.500 uIU/mL    Comment: Performed by a 3rd Generation assay with a functional sensitivity of <=0.01 uIU/mL. Performed at [facility], [address] [place], [place] [address]     Blood Alcohol level:  Lab Results  Component Value Date   ETH 308 [facility]) 03/12/2018   ETH 357 (HH) 11/15/2017    Metabolic Disorder Labs: Lab Results  Component Value Date   HGBA1C 4.9 03/13/2018   MPG 93.93 03/13/2018   Lab Results  Component Value Date   PROLACTIN 5.5 07/11/2016   PROLACTIN 31.8 (H) 03/26/2016   Lab Results  Component Value Date   CHOL 180 03/13/2018   TRIG 122 03/13/2018   HDL 109 03/13/2018   CHOLHDL 1.7 03/13/2018   VLDL 24 03/13/2018   LDLCALC 47 03/13/2018   LDLCALC 70 07/11/2016    Physical Findings: AIMS:  , ,  ,  ,    CIWA:  CIWA-Ar Total: 0 COWS:     Musculoskeletal: Strength & Muscle Tone: within normal limits Gait & Station: normal Patient leans: N/A  Psychiatric Specialty Exam: Physical Exam  Nursing note and vitals reviewed. Psychiatric: He has a normal mood and affect. His speech is normal and behavior is normal. Thought content normal. Cognition and memory are normal. He expresses impulsivity.    Review of Systems  Gastrointestinal: Positive for diarrhea.  Neurological: Negative.   Psychiatric/Behavioral: Negative.     Blood pressure (!) 127/93, pulse 80, temperature 97.9 F (36.6 C), temperature source Oral, resp. rate 18, height 5' 11.26" (1.81 m), weight 56.7 kg (125 lb), SpO2 100 %.Body mass index is 17.31 kg/m.  General Appearance: Casual  Eye Contact:  Good  Speech:  Clear and Coherent  Volume:  Normal  Mood:  Euthymic  Affect:  Appropriate  Thought Process:  Goal Directed and Descriptions of Associations: Intact  Orientation:  Full (Time, Place, and Person)  Thought Content:  WDL  Suicidal Thoughts:  No   Homicidal Thoughts:  No  Memory:  Immediate;   Fair Recent;   Fair Remote;   Fair  Judgement:  Poor  Insight:  Lacking  Psychomotor Activity:  Normal  Concentration:  Concentration: Fair and Attention Span: Fair  Recall:  [employer] of Knowledge:  Fair  Language:  Fair  Akathisia:  Negative  Handed:  Right  AIMS (if indicated):     Assets:  Communication Skills Desire for Improvement Housing Physical Health Resilience Social Support  ADL's:  Intact  Cognition:  WNL  Sleep:  Number of Hours: 8     Treatment Plan Summary: Daily contact with patient to assess and evaluate symptoms and progress in treatment and Medication management   Joshua [name] is a 27 year old male with a history of schizoaffective disorder, alcoholism, noncompliance admitted for disorganized psychotic behavior.  #Agitation -resolved  #Mood/psychosis, improved -restart Abilify 30 mg daily -Abilify maintena 400 mg monthly next injection on 04/09/2018  #Alcohol detox -Librium taper completed  #Substance abuse treatment -positive for alcohol and cannabis -patient minimizes problems and declines treatment  #Metabolic syndrome monitoring -Lipid panel, TSH and HgbA1C are pending -EKG pending  #  Smoking cessation -nicotine patch is available  #Disposition -discharge to home -follow up with [facility]     Kristine LineaJolanta Maccoy Haubner, MD 03/14/2018, 6:16 PM

## 2018-03-15 MED ORDER — ARIPIPRAZOLE ER 400 MG IM SUSR: 400.0000 mg | INTRAMUSCULAR | 1 refills | Status: DC

## 2018-03-15 MED ORDER — ARIPIPRAZOLE 30 MG PO TABS: 30.0000 mg | ORAL_TABLET | Freq: Every day | ORAL | 0 refills | Status: DC

## 2018-03-15 NOTE — BHH Group Notes (Signed)
LCSW Group Therapy Note 03/15/2018 1:15pm  Type of Therapy and Topic: Group Therapy: Feelings Around Returning Home & Establishing a Supportive Framework and Supporting Oneself When Supports Not Available  Participation Level: Active  Description of Group:  Patients first processed thoughts and feelings about upcoming discharge. These included fears of upcoming changes, lack of change, new living environments, judgements and expectations from others and overall stigma of mental health issues. The group then discussed the definition of a supportive framework, what that looks and feels like, and how do to discern it from an unhealthy non-supportive network. The group identified different types of supports as well as what to do when your family/friends are less than helpful or unavailable  Therapeutic Goals  1. Patient will identify one healthy supportive network that they can use at discharge. 2. Patient will identify one factor of a supportive framework and how to tell it from an unhealthy network. 3. Patient able to identify one coping skill to use when they do not have positive supports from others. 4. Patient will demonstrate ability to communicate their needs through discussion and/or role plays.  Summary of Patient Progress:  Pt reported he feels good today. Pt engaged during group session. As patients processed their anxiety about discharge and described healthy supports patient shared he is ready now that he is not drinking and back on his meds. He reported he has a good support system and his family is there for him. Patients identified at least one self-care tool they were willing to use after discharge.   Therapeutic Modalities Cognitive Behavioral Therapy Motivational Interviewing   Joshua [name]  CUEBAS-COLON, LCSW 03/15/2018 11:05 AM

## 2018-03-15 NOTE — BHH Counselor (Signed)
Received call from [employer] at approximately 2 am indicating that patient's STR # [id number] had been approved.

## 2018-03-15 NOTE — Plan of Care (Signed)
Patient aware of diagnosis  able to identify resources  available  . Voice no safety concerns .  Problem: Education: Goal: Knowledge of disease or condition will improve Outcome: Progressing Goal: Understanding of discharge needs will improve Outcome: Progressing   Problem: Health Behavior/Discharge Planning: Goal: Ability to identify changes in lifestyle to reduce recurrence of condition will improve Outcome: Progressing Goal: Identification of resources available to assist in meeting health care needs will improve Outcome: Progressing   Problem: Physical Regulation: Goal: Complications related to the disease process, condition or treatment will be avoided or minimized Outcome: Progressing   Problem: Safety: Goal: Ability to remain free from injury will improve Outcome: Progressing   Problem: Education: Goal: Knowledge of [place] General Education information/materials will improve Outcome: Progressing Goal: Emotional status will improve Outcome: Progressing Goal: Mental status will improve Outcome: Progressing Goal: Verbalization of understanding the information provided will improve Outcome: Progressing

## 2018-03-15 NOTE — [facility] Suicide Risk Assessment (Signed)
[facility]   Principal Problem: Schizoaffective disorder, bipolar type [facility](HCC) Discharge Diagnoses:  Patient Active Problem List   Diagnosis Date Noted  . Schizoaffective disorder, bipolar type (HCC) [F25.0] 07/16/2016    Priority: High  . Cannabis use disorder, moderate, dependence (HCC) [F12.20] 07/12/2016  . Tobacco use disorder [F17.200] 07/12/2016  . Noncompliance [Z91.19] 07/11/2016  . Alcohol use disorder, moderate, dependence (HCC) [F10.20]     Total Time spent with patient: 15 minutes plus 20 min for care coordination and documantation  Musculoskeletal: Strength & Muscle Tone: within normal limits Gait & Station: normal Patient leans: N/A  Psychiatric Specialty Exam: Review of Systems  Neurological: Negative.   Psychiatric/Behavioral: Positive for substance abuse.  All other systems reviewed and are negative.   Blood pressure 136/88, pulse (!) 105, temperature 98 F (36.7 C), temperature source Oral, resp. rate 18, height 5' 11.26" (1.81 m), weight 56.7 kg (125 lb), SpO2 100 %.Body mass index is 17.31 kg/m.  General Appearance: Casual  Eye Contact::  Good  Speech:  Clear and Coherent409  Volume:  Normal  Mood:  Euthymic  Affect:  Appropriate  Thought Process:  Goal Directed and Descriptions of Associations: Intact  Orientation:  Full (Time, Place, and Person)  Thought Content:  WDL  Suicidal Thoughts:  No  Homicidal Thoughts:  No  Memory:  Immediate;   Fair Recent;   Fair Remote;   Fair  Judgement:  Impaired  Insight:  Shallow  Psychomotor Activity:  Normal  Concentration:  Fair  Recall:  [employer]  Fund of Knowledge:Fair  Language: Fair  Akathisia:  No  Handed:  Right  AIMS (if indicated):     Assets:  Communication Skills Desire for Improvement Housing Physical Health Resilience Social Support  Sleep:  Number of Hours: 5.45  Cognition: WNL  ADL's:  Intact   Mental Status Per Nursing Assessment::   On Admission:      Demographic Factors:  Male and Unemployed  Loss Factors: Financial problems/change in socioeconomic status  Historical Factors: Prior suicide attempts, Family history of mental illness or substance abuse and Impulsivity  Risk Reduction Factors:   Sense of responsibility to family, Living with another person, especially a relative and Positive social support  Continued Clinical Symptoms:  Bipolar Disorder:   Mixed State Alcohol/Substance Abuse/Dependencies  Cognitive Features That Contribute To Risk:  None    Suicide Risk:  Minimal: No identifiable suicidal ideation.  Patients presenting with no risk factors but with morbid ruminations; may be classified as minimal risk based on the severity of the depressive symptoms    Plan Of Care/Follow-up recommendations:  Activity:  as tolerated  Diet:  regular Other:  keep follow up appointments  Kristine LineaJolanta Daishaun Ayre, MD 03/16/2018, 8:04 AM

## 2018-03-15 NOTE — Plan of Care (Signed)
Pt has been pleasant on the unit. Pt denies si/hi/ah/vh and pain at this time. Pt complain of sleep disturbance and was given Trazodone which was effective. Pt remains on Q 15 mins safety rounds. Will cont to monitor pt.  Problem: Education: Goal: Knowledge of disease or condition will improve Outcome: Progressing Goal: Understanding of discharge needs will improve Outcome: Progressing   Problem: Health Behavior/Discharge Planning: Goal: Ability to identify changes in lifestyle to reduce recurrence of condition will improve Outcome: Progressing Goal: Identification of resources available to assist in meeting health care needs will improve Outcome: Progressing   Problem: Physical Regulation: Goal: Complications related to the disease process, condition or treatment will be avoided or minimized Outcome: Progressing   Problem: Safety: Goal: Ability to remain free from injury will improve Outcome: Progressing   Problem: Education: Goal: Knowledge of General Education information will improve Outcome: Progressing   Problem: Health Behavior/Discharge Planning: Goal: Ability to manage health-related needs will improve Outcome: Progressing   Problem: Clinical Measurements: Goal: Ability to maintain clinical measurements within normal limits will improve Outcome: Progressing Goal: Will remain free from infection Outcome: Progressing Goal: Diagnostic test results will improve Outcome: Progressing Goal: Respiratory complications will improve Outcome: Progressing Goal: Cardiovascular complication will be avoided Outcome: Progressing   Problem: Activity: Goal: Risk for activity intolerance will decrease Outcome: Progressing   Problem: Nutrition: Goal: Adequate nutrition will be maintained Outcome: Progressing   Problem: Coping: Goal: Level of anxiety will decrease Outcome: Progressing   Problem: Elimination: Goal: Will not experience complications related to bowel  motility Outcome: Progressing Goal: Will not experience complications related to urinary retention Outcome: Progressing   Problem: Pain Managment: Goal: General experience of comfort will improve Outcome: Progressing   Problem: Safety: Goal: Ability to remain free from injury will improve Outcome: Progressing   Problem: Skin Integrity: Goal: Risk for impaired skin integrity will decrease Outcome: Progressing

## 2018-03-15 NOTE — Progress Notes (Signed)
D: Patient aware of diagnosis  able to identify resources  available  . Voice no safety concerns  Patient stated slept good last night .Stated appetite is good and energy level  Is normal. Stated concentration is good . Stated on Depression scale , hopeless and anxiety .( low 0-10 high) Denies suicidal  homicidal ideations  .  No auditory hallucinations  No pain concerns . Appropriate ADL'S. Interacting with peers and staff. [place] . Voice of lots of turmoil going on with family and he could not handle this now    A: Encourage patient participation with unit programming . Instruction  Given on  Medication , verbalize understanding. R: Voice no other concerns. Staff continue to monitor
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
## 2018-03-15 NOTE — Progress Notes (Signed)
EKG done. Results in chart.

## 2018-03-15 NOTE — Progress Notes (Signed)
[facility] MD Progress Note  03/15/2018 10:06 AM Vidit Boissonneault  MRN:  [id number]  Subjective:   Mr. Gruenwald feels much better. Paranoia and religious preoccupations are gone. No signs of alcohol withdrawal. Sleep and appetite are good. He is determined to maintain sobriety and will follow up with [facility] IOP. He will meet with Felicity Pellegrini, [facility], before discharge Monday. Tolerates medications well. No side effects reported.  Treatment plan. Continue Abilify maintena injections and oral Abilify for the next 3 weeks.  Social/disposition. Discharge with family. Follow up with [facility].  Principal Problem: Schizoaffective disorder, bipolar type (HCC) Diagnosis:   Patient Active Problem List   Diagnosis Date Noted  . Schizoaffective disorder, bipolar type (HCC) [F25.0] 07/16/2016    Priority: High  . Cannabis use disorder, moderate, dependence (HCC) [F12.20] 07/12/2016  . Tobacco use disorder [F17.200] 07/12/2016  . Noncompliance [Z91.19] 07/11/2016  . Alcohol use disorder, moderate, dependence (HCC) [F10.20]    Total Time spent with patient: 15 minutes  Past Psychiatric History: schizoaffective disorder  Past Medical History:  Past Medical History:  Diagnosis Date  . Bipolar 1 disorder (HCC)   . Schizophrenia (HCC)    History reviewed. No pertinent surgical history. Family History: History reviewed. No pertinent family history. Family Psychiatric  History: see H&P Social History:  Social History   Substance and Sexual Activity  Alcohol Use Yes  . Alcohol/week: 12.0 oz  . Types: 20 Cans of beer per week   Comment: 12/22/16- states he drank "a lot last night"     Social History   Substance and Sexual Activity  Drug Use Yes  . Types: Marijuana    Social History   Socioeconomic History  . Marital status: Single    Spouse name: Not on file  . Number of children: Not on file  . Years of education: Not on file  . Highest education level: Not on file  Occupational History  . Not on  file  Social Needs  . Financial resource strain: Not on file  . Food insecurity:    Worry: Not on file    Inability: Not on file  . Transportation needs:    Medical: Not on file    Non-medical: Not on file  Tobacco Use  . Smoking status: Current Every Day Smoker    Packs/day: 1.00    Years: 5.00    Pack years: 5.00    Types: Cigarettes  . Smokeless tobacco: Never Used  Substance and Sexual Activity  . Alcohol use: Yes    Alcohol/week: 12.0 oz    Types: 20 Cans of beer per week    Comment: 12/22/16- states he drank "a lot last night"  . Drug use: Yes    Types: Marijuana  . Sexual activity: Yes    Birth control/protection: None  Lifestyle  . Physical activity:    Days per week: Not on file    Minutes per session: Not on file  . Stress: Not on file  Relationships  . Social connections:    Talks on phone: Not on file    Gets together: Not on file    Attends religious service: Not on file    Active member of club or organization: Not on file    Attends meetings of clubs or organizations: Not on file    Relationship status: Not on file  Other Topics Concern  . Not on file  Social History Narrative  . Not on file   Additional Social History:  Sleep: Fair  Appetite:  Fair  Current Medications: Current Facility-Administered Medications  Medication Dose Route Frequency Provider Last Rate Last Dose  . acetaminophen (TYLENOL) tablet 650 mg  650 mg Oral Q6H PRN Amias Hutchinson B, MD      . alum & mag hydroxide-simeth (MAALOX/MYLANTA) 200-200-20 MG/5ML suspension 30 mL  30 mL Oral Q4H PRN Ura Hausen B, MD   30 mL at 03/13/18 2121  . ARIPiprazole (ABILIFY) tablet 30 mg  30 mg Oral Daily Madelina Sanda B, MD   30 mg at 03/15/18 0807  . ARIPiprazole ER (ABILIFY MAINTENA) injection 400 mg  400 mg Intramuscular Q28 days Eliceo Gladu B, MD   400 mg at 03/12/18 2046  . hydrOXYzine (ATARAX/VISTARIL) tablet 25 mg  25 mg Oral  TID PRN Choya Tornow B, MD   25 mg at 03/12/18 1853  . magnesium hydroxide (MILK OF MAGNESIA) suspension 30 mL  30 mL Oral Daily PRN Sharod Petsch B, MD      . nicotine (NICODERM CQ - dosed in mg/24 hours) patch 21 mg  21 mg Transdermal Q0600 Nyomie Ehrlich B, MD   21 mg at 03/15/18 0809  . traZODone (DESYREL) tablet 100 mg  100 mg Oral QHS PRN Kara Melching B, MD   100 mg at 03/14/18 2130    Lab Results: No results found for this or any previous visit (from the past 48 hour(s)).  Blood Alcohol level:  Lab Results  Component Value Date   ETH 308 (HH) 03/12/2018   ETH 357 (HH) 11/15/2017    Metabolic Disorder Labs: Lab Results  Component Value Date   HGBA1C 4.9 03/13/2018   MPG 93.93 03/13/2018   Lab Results  Component Value Date   PROLACTIN 5.5 07/11/2016   PROLACTIN 31.8 (H) 03/26/2016   Lab Results  Component Value Date   CHOL 180 03/13/2018   TRIG 122 03/13/2018   HDL 109 03/13/2018   CHOLHDL 1.7 03/13/2018   VLDL 24 03/13/2018   LDLCALC 47 03/13/2018   LDLCALC 70 07/11/2016    Physical Findings: AIMS:  , ,  ,  ,    CIWA:  CIWA-Ar Total: 0 COWS:     Musculoskeletal: Strength & Muscle Tone: within normal limits Gait & Station: normal Patient leans: N/A  Psychiatric Specialty Exam: Physical Exam  Nursing note and vitals reviewed. Psychiatric: He has a normal mood and affect. His speech is normal and behavior is normal. Thought content normal. Cognition and memory are normal. He expresses impulsivity.    Review of Systems  Neurological: Negative.   Psychiatric/Behavioral: Positive for substance abuse.  All other systems reviewed and are negative.   Blood pressure 109/77, pulse (!) 116, temperature 97.7 F (36.5 C), temperature source Oral, resp. rate 18, height 5' 11.26" (1.81 m), weight 56.7 kg (125 lb), SpO2 99 %.Body mass index is 17.31 kg/m.  General Appearance: Casual  Eye Contact:  Good  Speech:  Clear and Coherent  Volume:   Normal  Mood:  Euthymic  Affect:  Appropriate  Thought Process:  Goal Directed and Descriptions of Associations: Intact  Orientation:  Full (Time, Place, and Person)  Thought Content:  WDL  Suicidal Thoughts:  No  Homicidal Thoughts:  No  Memory:  Immediate;   Fair Recent;   Fair Remote;   Fair  Judgement:  Poor  Insight:  Lacking  Psychomotor Activity:  Normal  Concentration:  Concentration: Fair and Attention Span: Fair  Recall:  [employer]Fair  Fund of Knowledge:  Fair  Language:  Fair  Akathisia:  No  Handed:  Right  AIMS (if indicated):     Assets:  Communication Skills Desire for Improvement Housing Physical Health Resilience Social Support  ADL's:  Intact  Cognition:  WNL  Sleep:  Number of Hours: 6.45     Treatment Plan Summary: Daily contact with patient to assess and evaluate symptoms and progress in treatment and Medication management   Mr. Senft is a 27 year old male with a history of schizoaffective disorder, alcoholism, noncompliance admitted for disorganized psychotic behavior.  #Agitation -resolved  #Mood/psychosis, improved -continue Abilify 30 mg daily for the next 3 weeks -Abilify maintena 400 mg monthlynext injection on 04/09/2018  #Alcohol detox -Librium taper completed  #Substance abuse treatment -positive for alcohol and cannabis -patient is now interested in outpatient treatment  #Metabolic syndrome monitoring -Lipid panel, TSH and HgbA1C are unremarkable -EKG, QTc 430  #Smoking cessation -nicotine patch is available  #Disposition -discharge to home -follow up with [facility]    Kristine Linea, MD 03/15/2018, 10:06 AM

## 2018-03-16 NOTE — [facility] Suicide Risk Assessment (Signed)
[facility] INPATIENT:  Family/Significant Other Suicide Prevention Education  Suicide Prevention Education:  Education Completed; Joshua [name], Mother [phone],  (name of family member/significant other) has been identified by the patient as the family member/significant other with whom the patient will be residing, and identified as the person(s) who will aid the patient in the event of a mental health crisis (suicidal ideations/suicide attempt).  With written consent from the patient, the family member/significant other has been provided the following suicide prevention education, prior to the and/or following the discharge of the patient.  The suicide prevention education provided includes the following:  Suicide risk factors  Suicide prevention and interventions  National Suicide Hotline telephone number  [facility] assessment telephone number  [facility] Emergency Assistance 911  [facility] and/or Residential Mobile Crisis Unit telephone number  Request made of family/significant other to:  Remove weapons (e.g., guns, rifles, knives), all items previously/currently identified as safety concern.    Remove drugs/medications (over-the-counter, prescriptions, illicit drugs), all items previously/currently identified as a safety concern.  The family member/significant other verbalizes understanding of the suicide prevention education information provided.  The family member/significant other agrees to remove the items of safety concern listed above.  Joshua [name], Joshua [name] agrees to support Pt and was educated about Pt discharge plan and she is in agreement.  Will pick up and transport Pt when he is ready.  Cleda DaubSara P Faizah Kandler, LCSW 03/16/2018, 10:24 AM

## 2018-03-16 NOTE — Progress Notes (Signed)
  [facility] :  Will you be returning to the same living situation after discharge:  Yes,    At discharge, do you have transportation home?: Yes,   Mother to pick up Do you have the ability to pay for your medications: Yes,  referred to [facility]  Release of information consent forms completed and in the chart;  Patient's signature needed at discharge.  Patient to Follow up at: Follow-up Information    [facility] Go on 03/20/2018.   Why:  7:15am for Hospital Follow up and Peer Support Services, Please keep in touch with Mr. Unk PintoHarvey Bryant at [phone]. Contact information: [address] [place] [address] [phone]        [facility] Follow up.   Why:  Please follow up and complete new application so that you will be able to fill medications. [phone], [address] [place] [address]          Next level of care provider has access to [facility] Link:no  Safety Planning and Suicide Prevention discussed: Yes,     Have you used any form of tobacco in the last 30 days? (Cigarettes, Smokeless Tobacco, Cigars, and/or Pipes): Yes  Has patient been referred to the Quitline?: Patient refused referral  Patient has been referred for addiction treatment: Yes  Glennon MacSara P Gabby Rackers, LCSW 03/16/2018, 3:22 PM

## 2018-03-16 NOTE — Plan of Care (Signed)
Compliance with medication regimen, patient needs no help with ADLs,encourage patient to verbalize positive attributes about self to improve mental and emotional state , patient is safe in the unit, voice no concerns denies SI/HI , 15 minute safety rounds is maintained no distress noted. Problem: Education: Goal: Knowledge of disease or condition will improve Outcome: Progressing Goal: Understanding of discharge needs will improve Outcome: Progressing   Problem: Health Behavior/Discharge Planning: Goal: Ability to identify changes in lifestyle to reduce recurrence of condition will improve Outcome: Progressing Goal: Identification of resources available to assist in meeting health care needs will improve Outcome: Progressing   Problem: Physical Regulation: Goal: Complications related to the disease process, condition or treatment will be avoided or minimized Outcome: Progressing   Problem: Safety: Goal: Ability to remain free from injury will improve Outcome: Progressing   Problem: Education: Goal: Knowledge of [place] [facility] Education information/materials will improve Outcome: Progressing Goal: Emotional status will improve Outcome: Progressing Goal: Mental status will improve Outcome: Progressing Goal: Verbalization of understanding the information provided will improve Outcome: Progressing

## 2018-03-16 NOTE — Progress Notes (Signed)
Patient ID: Jean RosenthalDashawn Fetterman, male   DOB: May 03, 1991, 27 y.o.   MRN: [id number] CSW spoke with RN Dedra SkeensGwen who says Pt has been discharged with one prescription for Abilify to his mother. CSW sent Medication Management application since Pt has been served there before but needs to update his application.  CSW thought Pt needed more prescriptions than that and that he had a 7 day supply he would need. RN investigated and realized Pt had left without these.  CSw called back to Pt's mother who agreed to pick them up. CSW apologized for the inconvenience.  Jake SharkSara Jatniel Verastegui, LCSW

## 2018-03-16 NOTE — Progress Notes (Signed)
Recreation Therapy Notes   Date: 04.01.2019  Time: 9:30 am  Location: Craft Room  Behavioral response: Appropriate  Intervention Topic: Problem Solving  Discussion/Intervention: Group content on today was focused on problem solving. The group described what problem solving is. Patients expressed how problems affect them and how they deal with problems. Individuals identified healthy ways to deal with problems. Patients explained what normally happens to them when they do not deal with problems. The group expressed reoccurring problems for them. The group participated in the intervention "Ways to Solve problems" where patients were given a chance to explore different ways to solve problems.  Clinical Observations/Feedback:  Patient came to group and define problem solving as fixing things. He stated that it requires skills to problem solve. While in group he expressing that to solve any problem understanding is needed to understand the problem. Individual stated compromising and talking to others help with problem solving. Cranston Koors LRT/CTRS         [place] 03/16/2018 10:32 AM

## 2018-03-16 NOTE — Progress Notes (Signed)
D: Patient is aware of  Discharge this shift .Patient denies suicidal /homicidal ideations. Patient received all belongings brought in  A: No Storage medications. Writer reviewed Discharge Summary, Suicide Risk Assessment, and Transitional Record. A: . Aware  Of follow up appointment . R: Patient left unit with no questions  Or concerns  With mother

## 2018-03-17 ENCOUNTER — Emergency Department: Payer: Self-pay

## 2018-03-17 ENCOUNTER — Other Ambulatory Visit: Payer: Self-pay

## 2018-03-17 ENCOUNTER — Emergency Department
Admission: EM | Admit: 2018-03-17 | Discharge: 2018-03-17 | Disposition: A | Discharge status: Home / Self Care | Payer: Self-pay | Attending: Emergency Medicine | Admitting: Emergency Medicine

## 2018-03-17 DIAGNOSIS — R109 Unspecified abdominal pain: Secondary | ICD-10-CM

## 2018-03-17 DIAGNOSIS — F17210 Nicotine dependence, cigarettes, uncomplicated: Secondary | ICD-10-CM | POA: Insufficient documentation

## 2018-03-17 DIAGNOSIS — R197 Diarrhea, unspecified: Secondary | ICD-10-CM | POA: Insufficient documentation

## 2018-03-17 DIAGNOSIS — R112 Nausea with vomiting, unspecified: Secondary | ICD-10-CM | POA: Insufficient documentation

## 2018-03-17 DIAGNOSIS — R1031 Right lower quadrant pain: Secondary | ICD-10-CM | POA: Insufficient documentation

## 2018-03-17 LAB — URINALYSIS, COMPLETE (UACMP) WITH MICROSCOPIC
BACTERIA UA: NONE SEEN
Bilirubin Urine: NEGATIVE
Glucose, UA: NEGATIVE mg/dL
Hgb urine dipstick: NEGATIVE
Ketones, ur: NEGATIVE mg/dL
Leukocytes, UA: NEGATIVE
NITRITE: NEGATIVE
Protein, ur: 30 mg/dL — AB
RBC / HPF: NONE SEEN RBC/hpf (ref 0–5)
SPECIFIC GRAVITY, URINE: 1.024 (ref 1.005–1.030)
SQUAMOUS EPITHELIAL / LPF: NONE SEEN
WBC, UA: NONE SEEN WBC/hpf (ref 0–5)
pH: 8 (ref 5.0–8.0)

## 2018-03-17 LAB — CBC
HEMATOCRIT: 44.4 % (ref 40.0–52.0)
Hemoglobin: 14.2 g/dL (ref 13.0–18.0)
MCH: 28.8 pg (ref 26.0–34.0)
MCHC: 32.1 g/dL (ref 32.0–36.0)
MCV: 89.7 fL (ref 80.0–100.0)
Platelets: 209 10*3/uL (ref 150–440)
RBC: 4.95 MIL/uL (ref 4.40–5.90)
RDW: 13.6 % (ref 11.5–14.5)
WBC: 7.6 10*3/uL (ref 3.8–10.6)

## 2018-03-17 LAB — COMPREHENSIVE METABOLIC PANEL
ALBUMIN: 4.4 g/dL (ref 3.5–5.0)
ALT: 57 U/L (ref 17–63)
AST: 42 U/L — AB (ref 15–41)
Alkaline Phosphatase: 78 U/L (ref 38–126)
Anion gap: 8 (ref 5–15)
BILIRUBIN TOTAL: 0.4 mg/dL (ref 0.3–1.2)
BUN: 15 mg/dL (ref 6–20)
CO2: 30 mmol/L (ref 22–32)
Calcium: 9.4 mg/dL (ref 8.9–10.3)
Chloride: 103 mmol/L (ref 101–111)
Creatinine, Ser: 0.93 mg/dL (ref 0.61–1.24)
GFR calc Af Amer: >60 mL/min (ref >60)
GFR calc non Af Amer: >60 mL/min (ref >60)
GLUCOSE: 88 mg/dL (ref 65–99)
POTASSIUM: 4 mmol/L (ref 3.5–5.1)
Sodium: 141 mmol/L (ref 135–145)
TOTAL PROTEIN: 7.2 g/dL (ref 6.5–8.1)

## 2018-03-17 LAB — LIPASE, BLOOD: Lipase: 23 U/L (ref 11–51)

## 2018-03-17 MED ORDER — DICYCLOMINE HCL 20 MG PO TABS: 20.0000 mg | ORAL_TABLET | Freq: Three times a day (TID) | ORAL | 0 refills | Status: DC | PRN

## 2018-03-17 MED ORDER — ONDANSETRON 4 MG PO TBDP
4.0000 mg | ORAL_TABLET | Freq: Once | ORAL | Status: AC
  Administered 2018-03-17: 4 mg via ORAL
  Filled 2018-03-17: qty 1

## 2018-03-17 MED ORDER — OXYCODONE-ACETAMINOPHEN 5-325 MG PO TABS
2.0000 | ORAL_TABLET | Freq: Once | ORAL | Status: AC
  Administered 2018-03-17: 2 via ORAL
  Filled 2018-03-17: qty 2

## 2018-03-17 MED ORDER — ONDANSETRON 4 MG PO TBDP: 4.0000 mg | ORAL_TABLET | Freq: Three times a day (TID) | ORAL | 0 refills | Status: DC | PRN

## 2018-03-17 NOTE — ED Notes (Signed)
Patient transported to CT 

## 2018-03-17 NOTE — ED Provider Notes (Signed)
[facility] Emergency Department Provider Note       Time seen: ----------------------------------------- 8:53 AM on 03/17/2018 -----------------------------------------   I have reviewed the triage vital signs and the nursing notes.  HISTORY   Chief Complaint Abdominal Pain    HPI Joshua [name] is a 27 y.o. male with a history of bipolar disorder, schizophrenia, alcohol abuse who presents to the ED for right-sided abdominal pain for the past 3 days associated with nausea, vomiting and diarrhea.  Patient does report to frequent heavy alcohol use but he has not had a drink in the last week.  He denies fevers, chills or other complaints.  Past Medical History:  Diagnosis Date  . Bipolar 1 disorder (HCC)   . Schizophrenia [facility])     Patient Active Problem List   Diagnosis Date Noted  . Schizoaffective disorder, bipolar type (HCC) 07/16/2016  . Cannabis use disorder, moderate, dependence (HCC) 07/12/2016  . Tobacco use disorder 07/12/2016  . Noncompliance 07/11/2016  . Alcohol use disorder, moderate, dependence (HCC)     History reviewed. No pertinent surgical history.  Allergies Patient has no known allergies.  Social History Social History   Tobacco Use  . Smoking status: Current Every Day Smoker    Packs/day: 1.00    Years: 5.00    Pack years: 5.00    Types: Cigarettes  . Smokeless tobacco: Never Used  Substance Use Topics  . Alcohol use: Yes    Alcohol/week: 12.0 oz    Types: 20 Cans of beer per week    Comment: 12/22/16- states he drank "a lot last night"  . Drug use: Yes    Types: Marijuana   Review of Systems Constitutional: Negative for fever. Cardiovascular: Negative for chest pain. Respiratory: Negative for shortness of breath. Gastrointestinal: Positive for flank pain, vomiting and diarrhea Genitourinary: Negative for dysuria. Musculoskeletal: Negative for back pain. Skin: Negative for rash. Neurological: Negative for  headaches, focal weakness or numbness.  All systems negative/normal/unremarkable except as stated in the HPI  ____________________________________________   PHYSICAL EXAM:  VITAL SIGNS: ED Triage Vitals  Enc Vitals Group     BP 03/17/18 0723 (!) 140/93     Pulse Rate 03/17/18 0723 76     Resp 03/17/18 0723 18     Temp 03/17/18 0723 97.6 F (36.4 C)     Temp Source 03/17/18 0723 Oral     SpO2 03/17/18 0723 100 %     Weight 03/17/18 0724 125 lb (56.7 kg)     Height 03/17/18 0724 6\' 1"  (1.854 m)     Head Circumference --      Peak Flow --      Pain Score 03/17/18 0724 8     Pain Loc --      Pain Edu? --      Excl. in GC? --    Constitutional: Alert and oriented. Well appearing and in no distress. Eyes: Conjunctivae are normal. Normal extraocular movements. ENT   Head: Normocephalic and atraumatic.   Nose: No congestion/rhinnorhea.   Mouth/Throat: Mucous membranes are moist.   Neck: No stridor. Cardiovascular: Normal rate, regular rhythm. No murmurs, rubs, or gallops. Respiratory: Normal respiratory effort without tachypnea nor retractions. Breath sounds are clear and equal bilaterally. No wheezes/rales/rhonchi. Gastrointestinal: Mild right flank tenderness, no rebound or guarding.  Normal bowel sounds. Musculoskeletal: Nontender with normal range of motion in extremities. No lower extremity tenderness nor edema. Neurologic:  Normal speech and language. No gross focal neurologic deficits are appreciated.  Skin:  Skin is warm, dry and intact. No rash noted. Psychiatric: Mood and affect are normal. Speech and behavior are normal.  ____________________________________________  ED COURSE:  As part of my medical decision making, I reviewed the following data within the electronic MEDICAL RECORD NUMBER History obtained from family if available, nursing notes, old chart and ekg, as well as notes from prior ED visits. Patient presented for abdominal pain with vomiting and  diarrhea, we will assess with labs and imaging as indicated at this time.   Procedures ____________________________________________   LABS (pertinent positives/negatives)  Labs Reviewed  COMPREHENSIVE METABOLIC PANEL - Abnormal; Notable for the following components:      Result Value   AST 42 (*)    All other components within normal limits  URINALYSIS, COMPLETE (UACMP) WITH MICROSCOPIC - Abnormal; Notable for the following components:   Color, Urine YELLOW (*)    APPearance TURBID (*)    Protein, ur 30 (*)    All other components within normal limits  LIPASE, BLOOD  CBC    RADIOLOGY Images were viewed by me  CT renal protocol IMPRESSION: Negative for renal calculi.  No acute abnormality.  Examination is limited due to lack of oral and IV contrast and lack of body fat.  ____________________________________________  DIFFERENTIAL DIAGNOSIS   Gastroenteritis, musculoskeletal pain, renal colic, appendicitis  FINAL ASSESSMENT AND PLAN  Flank pain, vomiting and diarrhea   Plan: The patient had presented for abdominal pain with vomiting and diarrhea. Patient's labs are reassuring. Patient's imaging did not reveal any acute process.  Patient will be discharged with antiemetics and antidiarrheal agents.   Ulice DashJohnathan E Williams, MD   Note: This note was generated in part or whole with voice recognition software. Voice recognition is usually quite accurate but there are transcription errors that can and very often do occur. I apologize for any typographical errors that were not detected and corrected.     Emily FilbertWilliams, Jonathan E, MD 03/17/18 1004

## 2018-03-17 NOTE — ED Triage Notes (Signed)
Pt c/o RLQ pain for the past 3 days with N/V/D. States he does drink "a lot of alcohol"

## 2018-04-09 ENCOUNTER — Emergency Department
Admission: EM | Admit: 2018-04-09 | Discharge: 2018-04-10 | Disposition: A | Discharge status: Home / Self Care | Payer: Self-pay | Attending: Emergency Medicine | Admitting: Emergency Medicine

## 2018-04-09 ENCOUNTER — Emergency Department: Payer: Self-pay

## 2018-04-09 DIAGNOSIS — Z23 Encounter for immunization: Secondary | ICD-10-CM | POA: Insufficient documentation

## 2018-04-09 DIAGNOSIS — Y908 Blood alcohol level of 240 mg/100 ml or more: Secondary | ICD-10-CM | POA: Insufficient documentation

## 2018-04-09 DIAGNOSIS — S0081XA Abrasion of other part of head, initial encounter: Secondary | ICD-10-CM | POA: Insufficient documentation

## 2018-04-09 DIAGNOSIS — W01198A Fall on same level from slipping, tripping and stumbling with subsequent striking against other object, initial encounter: Secondary | ICD-10-CM | POA: Insufficient documentation

## 2018-04-09 DIAGNOSIS — F10929 Alcohol use, unspecified with intoxication, unspecified: Secondary | ICD-10-CM

## 2018-04-09 DIAGNOSIS — Y9389 Activity, other specified: Secondary | ICD-10-CM | POA: Insufficient documentation

## 2018-04-09 DIAGNOSIS — Y999 Unspecified external cause status: Secondary | ICD-10-CM | POA: Insufficient documentation

## 2018-04-09 DIAGNOSIS — Z79899 Other long term (current) drug therapy: Secondary | ICD-10-CM | POA: Insufficient documentation

## 2018-04-09 DIAGNOSIS — F17210 Nicotine dependence, cigarettes, uncomplicated: Secondary | ICD-10-CM | POA: Insufficient documentation

## 2018-04-09 DIAGNOSIS — Y92480 Sidewalk as the place of occurrence of the external cause: Secondary | ICD-10-CM | POA: Insufficient documentation

## 2018-04-09 DIAGNOSIS — F10229 Alcohol dependence with intoxication, unspecified: Secondary | ICD-10-CM | POA: Insufficient documentation

## 2018-04-09 MED ORDER — LORAZEPAM 2 MG/ML IJ SOLN
2.0000 mg | Freq: Once | INTRAMUSCULAR | Status: AC
Start: 2018-04-09 — End: 2018-04-09
  Administered 2018-04-09: 2 mg via INTRAVENOUS
  Filled 2018-04-09: qty 1

## 2018-04-09 MED ORDER — HALOPERIDOL LACTATE 5 MG/ML IJ SOLN
5.0000 mg | Freq: Once | INTRAMUSCULAR | Status: AC
  Administered 2018-04-09: 5 mg via INTRAVENOUS
  Filled 2018-04-09: qty 1

## 2018-04-09 MED ORDER — DIPHENHYDRAMINE HCL 50 MG/ML IJ SOLN
50.0000 mg | Freq: Once | INTRAMUSCULAR | Status: AC
Start: 2018-04-09 — End: 2018-04-09
  Administered 2018-04-09: 50 mg via INTRAVENOUS
  Filled 2018-04-09: qty 1

## 2018-04-09 MED ORDER — TETANUS-DIPHTH-ACELL PERTUSSIS 5-2.5-18.5 LF-MCG/0.5 IM SUSP
0.5000 mL | Freq: Once | INTRAMUSCULAR | Status: AC
  Administered 2018-04-09: 0.5 mL via INTRAMUSCULAR
  Filled 2018-04-09: qty 0.5

## 2018-04-09 NOTE — ED Provider Notes (Signed)
[facility] Emergency Department Provider Note  ____________________________________________   First MD Initiated Contact with Patient 04/09/18 2341     (approximate)  I have reviewed the triage vital signs and the nursing notes.   HISTORY  Chief Complaint No chief complaint on file.  History limited by the patient's alcohol intoxication  HPI Joshua [name] is a 27 y.o. male who comes to the emergency department via EMS after tripping and falling.  A neighbor called 911 after noting that the patient was stumbling on the sidewalk fell forward and hit his face.  The patient has been agitated and combative in route and is unable to provide any meaningful history.  Past Medical History:  Diagnosis Date  . Bipolar 1 disorder (HCC)   . Schizophrenia [facility])     Patient Active Problem List   Diagnosis Date Noted  . Schizoaffective disorder, bipolar type (HCC) 07/16/2016  . Cannabis use disorder, moderate, dependence (HCC) 07/12/2016  . Tobacco use disorder 07/12/2016  . Noncompliance 07/11/2016  . Alcohol use disorder, moderate, dependence (HCC)     History reviewed. No pertinent surgical history.  Prior to Admission medications   Medication Sig Start Date End Date Taking? Authorizing Provider  ARIPiprazole (ABILIFY) 30 MG tablet Take 1 tablet (30 mg total) by mouth daily. 03/15/18   Pucilowska, Jolanta B, MD  ARIPiprazole ER 400 MG SUSR Inject 400 mg into the muscle every 28 (twenty-eight) days. Next dose on 04/09/2018 03/15/18   Pucilowska, Braulio Conte B, MD  dicyclomine (BENTYL) 20 MG tablet Take 1 tablet (20 mg total) by mouth 3 (three) times daily as needed for spasms. 03/17/18   Emily Filbert, MD  ondansetron (ZOFRAN ODT) 4 MG disintegrating tablet Take 1 tablet (4 mg total) by mouth every 8 (eight) hours as needed for nausea or vomiting. 03/17/18   Emily Filbert, MD    Allergies Patient has no known allergies.  History reviewed. No  pertinent family history.  Social History Social History   Tobacco Use  . Smoking status: Current Every Day Smoker    Packs/day: 1.00    Years: 5.00    Pack years: 5.00    Types: Cigarettes  . Smokeless tobacco: Never Used  Substance Use Topics  . Alcohol use: Yes    Alcohol/week: 12.0 oz    Types: 20 Cans of beer per week    Comment: 12/22/16- states he drank "a lot last night"  . Drug use: Yes    Types: Marijuana    Review of Systems History limited by the patient's clinical condition  ____________________________________________   PHYSICAL EXAM:  VITAL SIGNS: ED Triage Vitals  Enc Vitals Group     BP      Pulse      Resp      Temp      Temp src      SpO2      Weight      Height      Head Circumference      Peak Flow      Pain Score      Pain Loc      Pain Edu?      Excl. in GC?     Constitutional: Agitated and uncooperative.  Heavy alcohol on his breath.  Mumbling slurred speech Eyes: PERRL EOMI. midrange and brisk Head: Multiple abrasions across the left side of his face with no actual laceration. Nose: No congestion/rhinnorhea. Mouth/Throat: No trismus Neck: No stridor.  No midline tenderness or  step-offs Cardiovascular: Tachycardic rate, regular rhythm. Grossly normal heart sounds.  Good peripheral circulation. Respiratory: Decreased respiratory effort.  No retractions. Lungs CTAB and moving good air Gastrointestinal: Soft nontender Musculoskeletal: No lower extremity edema   Neurologic: Moves all 4 Skin: Multiple abrasions as above Psychiatric: Agitated and combative    ____________________________________________   DIFFERENTIAL includes but not limited to  Alcohol intoxication, epidural hematoma, subdural hematoma, abrasion, laceration, cervical spine fracture, facial fracture ____________________________________________   LABS (all labs ordered are listed, but only abnormal results are displayed)  Labs Reviewed  COMPREHENSIVE METABOLIC  PANEL - Abnormal; Notable for the following components:      Result Value   Glucose, Bld 103 (*)    Calcium 8.8 (*)    Total Protein 8.3 (*)    Albumin 5.1 (*)    All other components within normal limits  ETHANOL - Abnormal; Notable for the following components:   Alcohol, Ethyl (B) 379 (*)    All other components within normal limits  URINE DRUG SCREEN, QUALITATIVE ([facility] ONLY) - Abnormal; Notable for the following components:   Cannabinoid 50 Ng, Ur [place] POSITIVE (*)    All other components within normal limits  CBC WITH DIFFERENTIAL/PLATELET - Abnormal; Notable for the following components:   Neutro Abs 7.3 (*)    All other components within normal limits  CK    Lab work reviewed by me with significantly elevated ethanol level __________________________________________  EKG  ED ECG REPORT I, Merrily BrittleNeil Eyoel Throgmorton, the attending physician, personally viewed and interpreted this ECG.  Date: 04/11/2018 EKG Time:  Rate: 95 Rhythm: normal sinus rhythm QRS Axis: normal Intervals: normal ST/T Wave abnormalities: normal Narrative Interpretation: no evidence of acute ischemia  ____________________________________________  RADIOLOGY  Head neck and face CTs reviewed by me with no acute disease ____________________________________________   PROCEDURES  Procedure(s) performed: no  .Critical Care Performed by: Merrily Brittleifenbark, Franco Duley, MD Authorized by: Merrily Brittleifenbark, Dakwon Wenberg, MD   Critical care provider statement:    Critical care time (minutes):  35   Critical care time was exclusive of:  Separately billable procedures and treating other patients   Critical care was necessary to treat or prevent imminent or life-threatening deterioration of the following conditions:  Toxidrome   Critical care was time spent personally by me on the following activities:  Development of treatment plan with patient or surrogate, discussions with consultants, evaluation of patient's response to treatment,  examination of patient, obtaining history from patient or surrogate, ordering and performing treatments and interventions, ordering and review of laboratory studies, ordering and review of radiographic studies, pulse oximetry, re-evaluation of patient's condition and review of old charts    Critical Care performed: Yes  Observation: no ____________________________________________   INITIAL IMPRESSION / ASSESSMENT AND PLAN / ED COURSE  Pertinent labs & imaging results that were available during my care of the patient were reviewed by me and considered in my medical decision making (see chart for details).  The patient arrives combative and altered with obvious facial trauma.  He is uncooperative with exam and intramuscular Haldol Ativan and Benadryl given the patient's own safety to facilitate a medical work-up searching for an organic cause of his behavior.  I do have significant concern for intracerebral pathology.  Fortunately the patient's CT scans are negative.  He likely has a concussion and alcohol intoxication.  We will continue to observe.  The patient is more awake and appropriate and is able to call a friend to bring him home.  He is able  to ambulate and eat and drink.  He is discharged home in improved condition verbalizes understanding agree with plan.      ____________________________________________   FINAL CLINICAL IMPRESSION(S) / ED DIAGNOSES  Final diagnoses:  Alcoholic intoxication with complication (HCC)  Abrasion of face, initial encounter      NEW MEDICATIONS STARTED DURING THIS VISIT:  Discharge Medication List as of 04/10/2018  6:53 AM       Note:  This document was prepared using Dragon voice recognition software and may include unintentional dictation errors.     Merrily Brittle, MD 04/11/18 [id number]

## 2018-04-09 NOTE — ED Triage Notes (Signed)
Pt arrived via EMS from home. Neighbors saw him fall face first onto concrete. Pt has lacerations on face and over left eye. Pt has Hx of Schizophernia and bipolar. Mother stated that he has not been on his medications. Pt is agitated and cursing. Police and EMS are still at bedside helping to control pt.

## 2018-04-10 ENCOUNTER — Emergency Department: Payer: Self-pay

## 2018-04-10 ENCOUNTER — Other Ambulatory Visit: Payer: Self-pay

## 2018-04-10 LAB — CBC WITH DIFFERENTIAL/PLATELET
BASOS ABS: 0 10*3/uL (ref 0–0.1)
Basophils Relative: 0 %
Eosinophils Absolute: 0.1 10*3/uL (ref 0–0.7)
Eosinophils Relative: 1 %
HEMATOCRIT: 43.7 % (ref 40.0–52.0)
HEMOGLOBIN: 14.7 g/dL (ref 13.0–18.0)
LYMPHS ABS: 2.2 10*3/uL (ref 1.0–3.6)
LYMPHS PCT: 22 %
MCH: 30 pg (ref 26.0–34.0)
MCHC: 33.6 g/dL (ref 32.0–36.0)
MCV: 89.3 fL (ref 80.0–100.0)
Monocytes Absolute: 0.3 10*3/uL (ref 0.2–1.0)
Monocytes Relative: 3 %
NEUTROS ABS: 7.3 10*3/uL — AB (ref 1.4–6.5)
Neutrophils Relative %: 74 %
PLATELETS: 262 10*3/uL (ref 150–440)
RBC: 4.89 MIL/uL (ref 4.40–5.90)
RDW: 14.4 % (ref 11.5–14.5)
WBC: 9.8 10*3/uL (ref 3.8–10.6)

## 2018-04-10 LAB — COMPREHENSIVE METABOLIC PANEL
ALBUMIN: 5.1 g/dL — AB (ref 3.5–5.0)
ALT: 21 U/L (ref 17–63)
AST: 31 U/L (ref 15–41)
Alkaline Phosphatase: 74 U/L (ref 38–126)
Anion gap: 14 (ref 5–15)
BILIRUBIN TOTAL: 0.6 mg/dL (ref 0.3–1.2)
BUN: 8 mg/dL (ref 6–20)
CO2: 23 mmol/L (ref 22–32)
Calcium: 8.8 mg/dL — ABNORMAL LOW (ref 8.9–10.3)
Chloride: 101 mmol/L (ref 101–111)
Creatinine, Ser: 0.88 mg/dL (ref 0.61–1.24)
GFR calc Af Amer: >60 mL/min (ref >60)
GLUCOSE: 103 mg/dL — AB (ref 65–99)
POTASSIUM: 3.7 mmol/L (ref 3.5–5.1)
Sodium: 138 mmol/L (ref 135–145)
TOTAL PROTEIN: 8.3 g/dL — AB (ref 6.5–8.1)

## 2018-04-10 LAB — URINE DRUG SCREEN, QUALITATIVE (ARMC ONLY)
AMPHETAMINES, UR SCREEN: NOT DETECTED
BENZODIAZEPINE, UR SCRN: NOT DETECTED
Barbiturates, Ur Screen: NOT DETECTED
Cannabinoid 50 Ng, Ur ~~LOC~~: POSITIVE — AB
Cocaine Metabolite,Ur ~~LOC~~: NOT DETECTED
MDMA (ECSTASY) UR SCREEN: NOT DETECTED
METHADONE SCREEN, URINE: NOT DETECTED
Opiate, Ur Screen: NOT DETECTED
PHENCYCLIDINE (PCP) UR S: NOT DETECTED
Tricyclic, Ur Screen: NOT DETECTED

## 2018-04-10 LAB — CK: Total CK: 350 U/L (ref 49–397)

## 2018-04-10 LAB — ETHANOL: ALCOHOL ETHYL (B): 379 mg/dL — AB (ref <10)

## 2018-04-10 NOTE — ED Notes (Signed)
Date and time results received: 04/10/18 0038 (use smartphrase ".now" to insert current time)  Test: ETOH Critical Value: 379  Name of Provider Notified: Dr. Lamont Snowballifenbark  Orders Received? Or Actions Taken?:

## 2018-04-10 NOTE — ED Notes (Signed)
Placed pt on 2L nasal cannula for comfort. Pt O2sat was in low 80s while sleeping.

## 2018-04-10 NOTE — ED Notes (Signed)
Patient urinated on self assisted nurse with changing linen and cleaning up patient. AS

## 2018-04-10 NOTE — Discharge Instructions (Signed)
Please do not ever drink that much alcohol again, is extremely dangerous for your health.  Please take ibuprofen as needed for pain and keep your wounds clean and dry.  It was a pleasure to take care of you today, and thank you for coming to our emergency department.  If you have any questions or concerns before leaving please ask the nurse to grab me and I'm more than happy to go through your aftercare instructions again.  If you were prescribed any opioid pain medication today such as Norco, Vicodin, Percocet, morphine, hydrocodone, or oxycodone please make sure you do not drive when you are taking this medication as it can alter your ability to drive safely.  If you have any concerns once you are home that you are not improving or are in fact getting worse before you can make it to your follow-up appointment, please do not hesitate to call 911 and come back for further evaluation.  Merrily Brittle, MD  Results for orders placed or performed during the hospital encounter of 04/09/18  Comprehensive metabolic panel  Result Value Ref Range   Sodium 138 135 - 145 mmol/L   Potassium 3.7 3.5 - 5.1 mmol/L   Chloride 101 101 - 111 mmol/L   CO2 23 22 - 32 mmol/L   Glucose, Bld 103 (H) 65 - 99 mg/dL   BUN 8 6 - 20 mg/dL   Creatinine, Ser 1.61 0.61 - 1.24 mg/dL   Calcium 8.8 (L) 8.9 - 10.3 mg/dL   Total Protein 8.3 (H) 6.5 - 8.1 g/dL   Albumin 5.1 (H) 3.5 - 5.0 g/dL   AST 31 15 - 41 U/L   ALT 21 17 - 63 U/L   Alkaline Phosphatase 74 38 - 126 U/L   Total Bilirubin 0.6 0.3 - 1.2 mg/dL   GFR calc non Af Amer >60 >60 mL/min   GFR calc Af Amer >60 >60 mL/min   Anion gap 14 5 - 15  Ethanol  Result Value Ref Range   Alcohol, Ethyl (B) 379 (HH) <10 mg/dL  Urine Drug Screen, Qualitative  Result Value Ref Range   Tricyclic, Ur Screen NONE DETECTED NONE DETECTED   Amphetamines, Ur Screen NONE DETECTED NONE DETECTED   MDMA (Ecstasy)Ur Screen NONE DETECTED NONE DETECTED   Cocaine Metabolite,Ur [place] NONE  DETECTED NONE DETECTED   Opiate, Ur Screen NONE DETECTED NONE DETECTED   Phencyclidine (PCP) Ur S NONE DETECTED NONE DETECTED   Cannabinoid 50 Ng, Ur [place] POSITIVE (A) NONE DETECTED   Barbiturates, Ur Screen NONE DETECTED NONE DETECTED   Benzodiazepine, Ur Scrn NONE DETECTED NONE DETECTED   Methadone Scn, Ur NONE DETECTED NONE DETECTED  CBC with Differential  Result Value Ref Range   WBC 9.8 3.8 - 10.6 K/uL   RBC 4.89 4.40 - 5.90 MIL/uL   Hemoglobin 14.7 13.0 - 18.0 g/dL   HCT 09.6 04.5 - 40.9 %   MCV 89.3 80.0 - 100.0 fL   MCH 30.0 26.0 - 34.0 pg   MCHC 33.6 32.0 - 36.0 g/dL   RDW 81.1 91.4 - 78.2 %   Platelets 262 150 - 440 K/uL   Neutrophils Relative % 74 %   Neutro Abs 7.3 (H) 1.4 - 6.5 K/uL   Lymphocytes Relative 22 %   Lymphs Abs 2.2 1.0 - 3.6 K/uL   Monocytes Relative 3 %   Monocytes Absolute 0.3 0.2 - 1.0 K/uL   Eosinophils Relative 1 %   Eosinophils Absolute 0.1 0 - 0.7 K/uL  Basophils Relative 0 %   Basophils Absolute 0.0 0 - 0.1 K/uL  CK  Result Value Ref Range   Total CK 350 49 - 397 U/L   Ct Head Wo Contrast  Result Date: 04/10/2018 CLINICAL DATA:  Patient fell face first onto concrete. Elevated alcohol level. Lacerations on the face and over the left eye. EXAM: CT HEAD WITHOUT CONTRAST CT MAXILLOFACIAL WITHOUT CONTRAST CT CERVICAL SPINE WITHOUT CONTRAST TECHNIQUE: Multidetector CT imaging of the head, cervical spine, and maxillofacial structures were performed using the standard protocol without intravenous contrast. Multiplanar CT image reconstructions of the cervical spine and maxillofacial structures were also generated. COMPARISON:  None. FINDINGS: CT HEAD FINDINGS Brain: No evidence of acute infarction, hemorrhage, hydrocephalus, extra-axial collection or mass lesion/mass effect. Vascular: No hyperdense vessel or unexpected calcification. Skull: Normal. Negative for fracture or focal lesion. Other: None. CT MAXILLOFACIAL FINDINGS: Examination is technically limited  due to motion artifact. Osseous: No acute or depressed orbital, facial, or mandibular fractures identified. Orbits: Negative. No traumatic or inflammatory finding. Sinuses: Retention cyst in the right maxillary antrum. No acute air-fluid levels in the paranasal sinuses. Mastoid air cells are clear. Soft tissues: Negative. CT CERVICAL SPINE FINDINGS: Examination is technically limited due to motion artifact. Alignment: Normal alignment of the cervical vertebrae and facet joints. C1-2 articulation appears intact. Skull base and vertebrae: Skull base appears intact. No vertebral compression deformities. No focal bone lesion or bone destruction. Soft tissues and spinal canal: No prevertebral soft tissue swelling. No paraspinal soft tissue mass or infiltration. Disc levels:  Intervertebral disc space heights are preserved. Upper chest: Lung apices are clear. Other: None. IMPRESSION: 1. No acute intracranial abnormalities. 2. Normal alignment of the cervical spine. No acute displaced fractures identified. 3. No acute depressed orbital or facial fractures identified. Electronically Signed   By: Burman NievesWilliam  Stevens M.D.   On: 04/10/2018 02:22   Ct Cervical Spine Wo Contrast  Result Date: 04/10/2018 CLINICAL DATA:  Patient fell face first onto concrete. Elevated alcohol level. Lacerations on the face and over the left eye. EXAM: CT HEAD WITHOUT CONTRAST CT MAXILLOFACIAL WITHOUT CONTRAST CT CERVICAL SPINE WITHOUT CONTRAST TECHNIQUE: Multidetector CT imaging of the head, cervical spine, and maxillofacial structures were performed using the standard protocol without intravenous contrast. Multiplanar CT image reconstructions of the cervical spine and maxillofacial structures were also generated. COMPARISON:  None. FINDINGS: CT HEAD FINDINGS Brain: No evidence of acute infarction, hemorrhage, hydrocephalus, extra-axial collection or mass lesion/mass effect. Vascular: No hyperdense vessel or unexpected calcification. Skull:  Normal. Negative for fracture or focal lesion. Other: None. CT MAXILLOFACIAL FINDINGS: Examination is technically limited due to motion artifact. Osseous: No acute or depressed orbital, facial, or mandibular fractures identified. Orbits: Negative. No traumatic or inflammatory finding. Sinuses: Retention cyst in the right maxillary antrum. No acute air-fluid levels in the paranasal sinuses. Mastoid air cells are clear. Soft tissues: Negative. CT CERVICAL SPINE FINDINGS: Examination is technically limited due to motion artifact. Alignment: Normal alignment of the cervical vertebrae and facet joints. C1-2 articulation appears intact. Skull base and vertebrae: Skull base appears intact. No vertebral compression deformities. No focal bone lesion or bone destruction. Soft tissues and spinal canal: No prevertebral soft tissue swelling. No paraspinal soft tissue mass or infiltration. Disc levels:  Intervertebral disc space heights are preserved. Upper chest: Lung apices are clear. Other: None. IMPRESSION: 1. No acute intracranial abnormalities. 2. Normal alignment of the cervical spine. No acute displaced fractures identified. 3. No acute depressed orbital or facial fractures identified.  Electronically Signed   By: Burman Nieves M.D.   On: 04/10/2018 02:22   Ct Renal Stone Study  Result Date: 03/17/2018 CLINICAL DATA:  Right flank pain.  Suspect kidney stone EXAM: CT ABDOMEN AND PELVIS WITHOUT CONTRAST TECHNIQUE: Multidetector CT imaging of the abdomen and pelvis was performed following the standard protocol without IV contrast. COMPARISON:  CT abdomen pelvis 09/12/2015 FINDINGS: Lower chest: Negative Hepatobiliary: Unenhanced images liver negative. Gallbladder contracted without thickening. Bile ducts not well seen due to lack of contrast. No definite biliary dilatation Pancreas: Suboptimal visualization of pancreas due to lack of oral and IV contrast. No evidence pancreatitis. Spleen: Normal splenic size  Adrenals/Urinary Tract: Negative for renal obstruction. No renal calculi or mass. Urinary bladder partially filled negative. Stomach/Bowel: Limited bowel evaluation due to lack of oral and IV contrast and lack of body fat. Negative for bowel obstruction. Vascular/Lymphatic: Negative Reproductive: Normal prostate. Other: No free fluid Musculoskeletal: Negative IMPRESSION: Negative for renal calculi.  No acute abnormality. Examination is limited due to lack of oral and IV contrast and lack of body fat. Electronically Signed   By: Marlan Palau M.D.   On: 03/17/2018 09:02   Ct Maxillofacial Wo Cm  Result Date: 04/10/2018 CLINICAL DATA:  Patient fell face first onto concrete. Elevated alcohol level. Lacerations on the face and over the left eye. EXAM: CT HEAD WITHOUT CONTRAST CT MAXILLOFACIAL WITHOUT CONTRAST CT CERVICAL SPINE WITHOUT CONTRAST TECHNIQUE: Multidetector CT imaging of the head, cervical spine, and maxillofacial structures were performed using the standard protocol without intravenous contrast. Multiplanar CT image reconstructions of the cervical spine and maxillofacial structures were also generated. COMPARISON:  None. FINDINGS: CT HEAD FINDINGS Brain: No evidence of acute infarction, hemorrhage, hydrocephalus, extra-axial collection or mass lesion/mass effect. Vascular: No hyperdense vessel or unexpected calcification. Skull: Normal. Negative for fracture or focal lesion. Other: None. CT MAXILLOFACIAL FINDINGS: Examination is technically limited due to motion artifact. Osseous: No acute or depressed orbital, facial, or mandibular fractures identified. Orbits: Negative. No traumatic or inflammatory finding. Sinuses: Retention cyst in the right maxillary antrum. No acute air-fluid levels in the paranasal sinuses. Mastoid air cells are clear. Soft tissues: Negative. CT CERVICAL SPINE FINDINGS: Examination is technically limited due to motion artifact. Alignment: Normal alignment of the cervical  vertebrae and facet joints. C1-2 articulation appears intact. Skull base and vertebrae: Skull base appears intact. No vertebral compression deformities. No focal bone lesion or bone destruction. Soft tissues and spinal canal: No prevertebral soft tissue swelling. No paraspinal soft tissue mass or infiltration. Disc levels:  Intervertebral disc space heights are preserved. Upper chest: Lung apices are clear. Other: None. IMPRESSION: 1. No acute intracranial abnormalities. 2. Normal alignment of the cervical spine. No acute displaced fractures identified. 3. No acute depressed orbital or facial fractures identified. Electronically Signed   By: Burman Nieves M.D.   On: 04/10/2018 02:22

## 2019-05-07 ENCOUNTER — Emergency Department
Admission: EM | Admit: 2019-05-07 | Discharge: 2019-05-08 | Disposition: A | Discharge status: Home / Self Care | Payer: Self-pay | Attending: Student in an Organized Health Care Education/Training Program | Admitting: Student in an Organized Health Care Education/Training Program

## 2019-05-07 ENCOUNTER — Other Ambulatory Visit: Payer: Self-pay

## 2019-05-07 DIAGNOSIS — F4321 Adjustment disorder with depressed mood: Secondary | ICD-10-CM | POA: Insufficient documentation

## 2019-05-07 DIAGNOSIS — F17210 Nicotine dependence, cigarettes, uncomplicated: Secondary | ICD-10-CM | POA: Insufficient documentation

## 2019-05-07 DIAGNOSIS — Z79899 Other long term (current) drug therapy: Secondary | ICD-10-CM | POA: Insufficient documentation

## 2019-05-07 DIAGNOSIS — F250 Schizoaffective disorder, bipolar type: Secondary | ICD-10-CM | POA: Insufficient documentation

## 2019-05-07 DIAGNOSIS — F419 Anxiety disorder, unspecified: Secondary | ICD-10-CM

## 2019-05-07 NOTE — ED Provider Notes (Signed)
[facility] Emergency Department Provider Note    First MD Initiated Contact with Patient 05/07/19 2125     (approximate)  I have reviewed the triage vital signs and the nursing notes.   HISTORY  Chief Complaint Psychiatric Evaluation    HPI Joshua [name] is a 28 y.o. male below listed past medical history presents the ER for evaluation of severe daily and patient having racing thoughts and difficulty coping with grief after the loss of his family member.  States he does not have anyone to talk to at home.  Feels like things are spiraling out of control.  Denies any SI or HI.  He has been compliant with his medications.  He denies any substance abuse.  No nausea vomiting or diarrhea.    Past Medical History:  Diagnosis Date  . Bipolar 1 disorder (HCC)   . Schizophrenia (HCC)    No family history on file. History reviewed. No pertinent surgical history. Patient Active Problem List   Diagnosis Date Noted  . Schizoaffective disorder, bipolar type (HCC) 07/16/2016  . Cannabis use disorder, moderate, dependence (HCC) 07/12/2016  . Tobacco use disorder 07/12/2016  . Noncompliance 07/11/2016  . Alcohol use disorder, moderate, dependence (HCC)       Prior to Admission medications   Medication Sig Start Date End Date Taking? Authorizing Provider  ARIPiprazole (ABILIFY) 30 MG tablet Take 1 tablet (30 mg total) by mouth daily. 03/15/18   Pucilowska, Jolanta B, MD  ARIPiprazole ER 400 MG SUSR Inject 400 mg into the muscle every 28 (twenty-eight) days. Next dose on 04/09/2018 03/15/18   Pucilowska, Braulio ConteJolanta B, MD  dicyclomine (BENTYL) 20 MG tablet Take 1 tablet (20 mg total) by mouth 3 (three) times daily as needed for spasms. 03/17/18   Emily FilbertWilliams, Jonathan E, MD  ondansetron (ZOFRAN ODT) 4 MG disintegrating tablet Take 1 tablet (4 mg total) by mouth every 8 (eight) hours as needed for nausea or vomiting. 03/17/18   Emily FilbertWilliams, Jonathan E, MD    Allergies Patient  has no known allergies.    Social History Social History   Tobacco Use  . Smoking status: Current Every Day Smoker    Packs/day: 1.00    Years: 5.00    Pack years: 5.00    Types: Cigarettes  . Smokeless tobacco: Never Used  Substance Use Topics  . Alcohol use: Not Currently    Alcohol/week: 20.0 standard drinks    Types: 20 Cans of beer per week    Comment: 12/22/16- states he drank "a lot last night"  . Drug use: Yes    Types: Marijuana    Review of Systems Patient denies headaches, rhinorrhea, blurry vision, numbness, shortness of breath, chest pain, edema, cough, abdominal pain, nausea, vomiting, diarrhea, dysuria, fevers, rashes or hallucinations unless otherwise stated above in HPI. ____________________________________________   PHYSICAL EXAM:  VITAL SIGNS: Vitals:   05/07/19 2051  BP: (!) 119/96  Pulse: 76  Resp: 18  Temp: 98.3 F (36.8 C)  SpO2: 100%    Constitutional: Alert and oriented.  Eyes: Conjunctivae are normal.  Head: Atraumatic. Nose: No congestion/rhinnorhea. Mouth/Throat: Mucous membranes are moist.   Neck: No stridor. Painless ROM.  Cardiovascular: Normal rate, regular rhythm. Grossly normal heart sounds.  Good peripheral circulation. Respiratory: Normal respiratory effort.  No retractions. Lungs CTAB. Gastrointestinal: Soft and nontender. No distention. No abdominal bruits. No CVA tenderness. Genitourinary:  Musculoskeletal: No lower extremity tenderness nor edema.  No joint effusions. Neurologic:  Normal speech and language.  No gross focal neurologic deficits are appreciated. No facial droop Skin:  Skin is warm, dry and intact. No rash noted. Psychiatric: Mood and affect are anxious.. Pressured speech at times difficult to follow his train of thought ____________________________________________   LABS (all labs ordered are listed, but only abnormal results are displayed)  No results found for this or any previous visit (from the past 24  hour(s)). ____________________________________________ ____________________________________________  RADIOLOGY   ____________________________________________   PROCEDURES  Procedure(s) performed:  Procedures    Critical Care performed: no ____________________________________________   INITIAL IMPRESSION / ASSESSMENT AND PLAN / ED COURSE  Pertinent labs & imaging results that were available during my care of the patient were reviewed by me and considered in my medical decision making (see chart for details).   DDX: Psychosis, delirium, medication effect, noncompliance, polysubstance abuse, Si, Hi, depression   Joshua [name] is a 28 y.o. who presents to the ED with symptoms as described above.  Denies any indication for IVC at this time he is agreeable to psychiatric evaluation.  Patient clearly having difficulty coping with grief and given his underlying history of schizophrenia I am concerned that he is starting to decompensate.  Fortunately he is agreeable to evaluation and consultation here in the ER.  He denies any SI or HI.     The patient was evaluated in Emergency Department today for the symptoms described in the history of present illness. He/she was evaluated in the context of the global COVID-19 pandemic, which necessitated consideration that the patient might be at risk for infection with the SARS-CoV-2 virus that causes COVID-19. Institutional protocols and algorithms that pertain to the evaluation of patients at risk for COVID-19 are in a state of rapid change based on information released by regulatory bodies including the CDC and federal and state organizations. These policies and algorithms were followed during the patient's care in the ED.   As part of my medical decision making, I reviewed the following data within the electronic MEDICAL RECORD NUMBER Nursing notes reviewed and incorporated, Labs reviewed, notes from prior ED visits and  Controlled Substance  Database   ____________________________________________   FINAL CLINICAL IMPRESSION(S) / ED DIAGNOSES  Final diagnoses:  Grief  Anxiety      NEW MEDICATIONS STARTED DURING THIS VISIT:  New Prescriptions   No medications on file     Note:  This document was prepared using Dragon voice recognition software and may include unintentional dictation errors.    Willy Eddy, MD 05/07/19 2154

## 2019-05-07 NOTE — ED Notes (Signed)
Pt. Changing out clothes in triage.  Pt. Has one pair of dark sneakers, black dress shirt.

## 2019-05-07 NOTE — ED Notes (Signed)
SOC called report given, SOC machine placed in patients room, pt. Awake and ready.

## 2019-05-07 NOTE — ED Notes (Signed)
TTS machine placed in room, pt. Ready.

## 2019-05-07 NOTE — ED Notes (Signed)
Pt. Requested to use phone to let mother know where he is.  Pt. Given phone to call mother.

## 2019-05-07 NOTE — ED Triage Notes (Signed)
Patient states: "I just can't get peace right now". Patient denies SI and HI. Patient reports that his sister's significant other shot and killed himself and patient's sister.

## 2019-05-07 NOTE — ED Notes (Signed)
Pt. States recent death in family(sister).  Pt. States difficulty with family in dealing with loss.  Pt. Denies SI or HI, just very upset family is not listening to him and his grief.  Pt. Is anxious, but calm and cooperative.

## 2019-05-07 NOTE — BH Assessment (Signed)
Assessment Note  Joshua [name] is an 28 y.o. male. Who self reports a history of schizophrenia disorder. Pt shares that he has be unmanaged and unmediated for several years. He [facility] and on previous suicide attempt, several years ago. Pt states that he recently experienced a loss,as his sister passed away approximately two weeks ago. Pt states "I live with my mother and had to move with my grandmother because she is taking care of my sisters children."  Pt reports that he is struggling with his grief as his family is not supportive. He continues to explain "I just needed to get away and gather myself." He has expressed desire to begin grief counseling and has contracted for safety outside of the hospital system.   After thorough evaluation and review of information currently present on examination, there are insufficient findings to indicate patient meets criteria for IVC or requires an inpatient level of care. Patient is linear, direct, and organized; denies AVH and is not currently significantly impaired, psychotic, or manic on exam.Pt. denies any suicidal ideation, plan or intent. Pt. denies the presence of any auditory or visual hallucinations at this time. Patient denies any other medical complaints.    Diagnosis: Bipolar Disorder   Past Medical History:  Past Medical History:  Diagnosis Date  . Bipolar 1 disorder (HCC)   . Schizophrenia (HCC)     History reviewed. No pertinent surgical history.  Family History: No family history on file.  Social History:  reports that he has been smoking cigarettes. He has a 5.00 pack-year smoking history. He has never used smokeless tobacco. He reports previous alcohol use of about 20.0 standard drinks of alcohol per week. He reports current drug use. Drug: Marijuana.  Additional Social History:  Alcohol / Drug Use Pain Medications: SEE PTA  Prescriptions: SEE PTA  Over the Counter: SEE PTA  History of alcohol  / drug use?: Yes Longest period of sobriety (when/how long): 3 months Negative Consequences of Use: Legal, Personal relationships, Financial  CIWA: CIWA-Ar BP: (!) 119/96 Pulse Rate: 76 COWS:    Allergies: No Known Allergies  Home Medications: (Not in a hospital admission)   OB/GYN Status:  No LMP for male patient.  General Assessment Data [facility] Assessment: In system Is this a Tele or Face-to-Face Assessment?: Tele Assessment Is this an Initial Assessment or a Re-assessment for this encounter?: Initial Assessment Patient Accompanied by:: N/A Living Arrangements: Other (Comment) What gender do you identify as?: Male Marital status: Single Living Arrangements: Parent Can pt return to current living arrangement?: Yes Admission Status: Voluntary Is patient capable of signing voluntary admission?: Yes Referral Source: Self/Family/Friend Insurance type: none   Medical Screening Exam [facility]-in ONLY) Medical Exam completed: Yes  Crisis Care Plan Living Arrangements: Parent Legal Guardian: Other:(none ) Name of Psychiatrist: none  Name of Therapist: none   Education Status Is patient currently in school?: No Is the patient employed, unemployed or receiving disability?: Unemployed  Risk to self with the past 6 months Suicidal Ideation: No Has patient been a risk to self within the past 6 months prior to admission? : No Suicidal Intent: No Has patient had any suicidal intent within the past 6 months prior to admission? : No Is patient at risk for suicide?: No, but patient needs Medical Clearance Suicidal Plan?: No Has patient had any suicidal plan within the past 6 months prior to admission? : No Access to Means: No What has been  your use of drugs/alcohol within the last 12 months?: history of alcohol ues  Previous Attempts/Gestures: No How many times?: 1 Other Self Harm Risks: none Triggers for Past Attempts: Unknown Intentional Self  Injurious Behavior: None Family Suicide History: No Recent stressful life event(s): Trauma (Comment), Loss (Comment) Persecutory voices/beliefs?: Yes Depression: Yes Depression Symptoms: Insomnia, Guilt, Isolating Substance abuse history and/or treatment for substance abuse?: Yes(history of alcohol use ) Suicide prevention information given to non-admitted patients: Not applicable  Risk to Others within the past 6 months Homicidal Ideation: No Does patient have any lifetime risk of violence toward others beyond the six months prior to admission? : No Thoughts of Harm to Others: No Current Homicidal Intent: No Current Homicidal Plan: No Access to Homicidal Means: No Assessment of Violence: None Noted Does patient have access to weapons?: No Criminal Charges Pending?: No Does patient have a court date: No Is patient on probation?: No  Psychosis Hallucinations: None noted Delusions: None noted  Mental Status Report Appearance/Hygiene: In scrubs Eye Contact: Fair Motor Activity: Freedom of movement Speech: Logical/coherent Level of Consciousness: Quiet/awake Mood: Anxious Affect: Anxious Anxiety Level: Moderate Thought Processes: Coherent Judgement: Unimpaired Orientation: Place, Person, Situation, Time Obsessive Compulsive Thoughts/Behaviors: None  Cognitive Functioning Concentration: Good Memory: Recent Intact, Remote Intact Is patient IDD: No Insight: Fair Impulse Control: Fair Appetite: Poor Have you had any weight changes? : No Change Amount of the weight change? (lbs): 0 lbs Sleep: Decreased Total Hours of Sleep: 6 Vegetative Symptoms: None  ADLScreening [facility] Assessment Services) Patient's cognitive ability adequate to safely complete daily activities?: Yes Patient able to express need for assistance with ADLs?: Yes Independently performs ADLs?: Yes (appropriate for developmental age)  Prior Inpatient Therapy Prior Inpatient Therapy: Yes Prior Therapy  Dates: 2017 Prior Therapy Facilty/Provider(s): [facility]  Reason for Treatment: Schizophrenia   Prior Outpatient Therapy Prior Outpatient Therapy: No Does patient have an ACCT team?: No Does patient have Intensive In-House Services?  : No Does patient have [facility] services? : No Does patient have P4CC services?: No  ADL Screening (condition at time of admission) Patient's cognitive ability adequate to safely complete daily activities?: Yes Patient able to express need for assistance with ADLs?: Yes Independently performs ADLs?: Yes (appropriate for developmental age)       Abuse/Neglect Assessment (Assessment to be complete while patient is alone) Abuse/Neglect Assessment Can Be Completed: Yes Physical Abuse: Denies Verbal Abuse: Denies Sexual Abuse: Denies Exploitation of patient/patient's resources: Denies Self-Neglect: Denies Values / Beliefs Cultural Requests During Hospitalization: None Spiritual Requests During Hospitalization: None Consults Spiritual Care Consult Needed: No Social Work Consult Needed: No Merchant [employer] officer (For Healthcare) Does Patient Have a Medical Advance Directive?: No Would patient like information on creating a medical advance directive?: No - Patient declined          Disposition:  Disposition Initial Assessment Completed for this Encounter: Yes Disposition of Patient: Discharge Patient refused recommended treatment: No Mode of transportation if patient is discharged/movement?: Other (comment) Patient referred to: Outpatient clinic referral  On Site Evaluation by:   Reviewed with Physician:    Asa Saunas 05/07/2019 10:50 PM

## 2019-05-08 LAB — CBC
HCT: 43.9 % (ref 39.0–52.0)
Hemoglobin: 14.5 g/dL (ref 13.0–17.0)
MCH: 28.6 pg (ref 26.0–34.0)
MCHC: 33 g/dL (ref 30.0–36.0)
MCV: 86.6 fL (ref 80.0–100.0)
Platelets: 282 10*3/uL (ref 150–400)
RBC: 5.07 MIL/uL (ref 4.22–5.81)
RDW: 14 % (ref 11.5–15.5)
WBC: 9 10*3/uL (ref 4.0–10.5)
nRBC: 0 % (ref 0.0–0.2)

## 2019-05-08 LAB — COMPREHENSIVE METABOLIC PANEL
ALT: 27 U/L (ref 0–44)
AST: 54 U/L — ABNORMAL HIGH (ref 15–41)
Albumin: 5.3 g/dL — ABNORMAL HIGH (ref 3.5–5.0)
Alkaline Phosphatase: 67 U/L (ref 38–126)
Anion gap: 13 (ref 5–15)
BUN: 16 mg/dL (ref 6–20)
CO2: 26 mmol/L (ref 22–32)
Calcium: 9.6 mg/dL (ref 8.9–10.3)
Chloride: 98 mmol/L (ref 98–111)
Creatinine, Ser: 0.88 mg/dL (ref 0.61–1.24)
GFR calc Af Amer: >60 mL/min (ref >60)
GFR calc non Af Amer: >60 mL/min (ref >60)
Glucose, Bld: 72 mg/dL (ref 70–99)
Potassium: 4.1 mmol/L (ref 3.5–5.1)
Sodium: 137 mmol/L (ref 135–145)
Total Bilirubin: 1.9 mg/dL — ABNORMAL HIGH (ref 0.3–1.2)
Total Protein: 8.1 g/dL (ref 6.5–8.1)

## 2019-05-08 LAB — SALICYLATE LEVEL: Salicylate Lvl: <7 mg/dL (ref 2.8–30.0)

## 2019-05-08 LAB — ACETAMINOPHEN LEVEL: Acetaminophen (Tylenol), Serum: <10 ug/mL — ABNORMAL LOW (ref 10–30)

## 2019-05-08 LAB — ETHANOL: Alcohol, Ethyl (B): <10 mg/dL (ref <10)

## 2019-05-08 NOTE — ED Notes (Signed)
SOC called back and advised pt. Could be discharged with out-patient resources.

## 2019-05-08 NOTE — ED Notes (Signed)
Pt. Discharged and given taxi cab voucher to grandmothers house.

## 2019-05-08 NOTE — ED Provider Notes (Signed)
-----------------------------------------   3:14 AM on 05/08/2019 -----------------------------------------  Patient here voluntarily, has no SI no HI, cleared for psychiatry for discharge, he contracts for safety, no other complaints we will discharge.    Jeanmarie Plant, MD 05/08/19 [phone]

## 2019-10-24 IMAGING — CT CT RENAL STONE PROTOCOL
3 of 4 series · 7 of 46 positions shown, 13 images · non-contrast
Comparison: CT abdomen pelvis 09/12/2015

CLINICAL DATA: Right flank pain.  Suspect kidney stone

EXAM:
CT ABDOMEN AND PELVIS WITHOUT CONTRAST
TECHNIQUE: Multidetector CT imaging of the abdomen and pelvis was performed
following the standard protocol without IV contrast.

[Series 4: lung bases · axial · 0.60mm/px · z∈[-204,-154]mm · 3 of 20 slices shown, 7 images]
[im 5/20  soft-tissue]
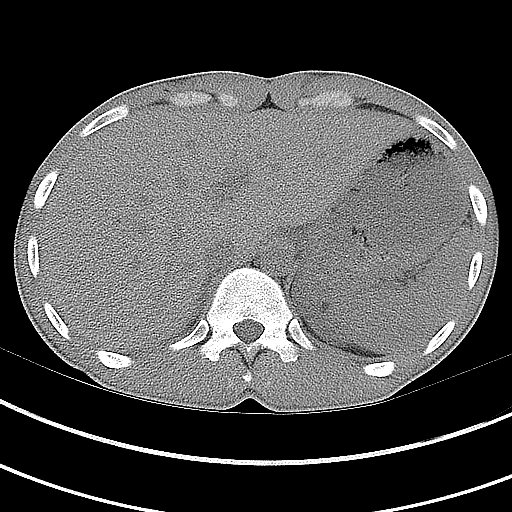
[im 5/20  lung]
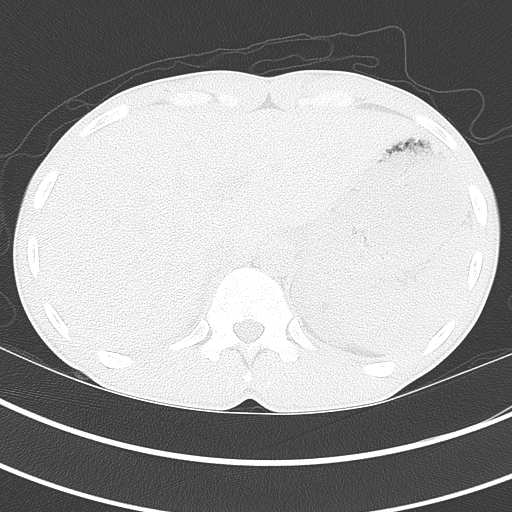
[im 5/20  bone]
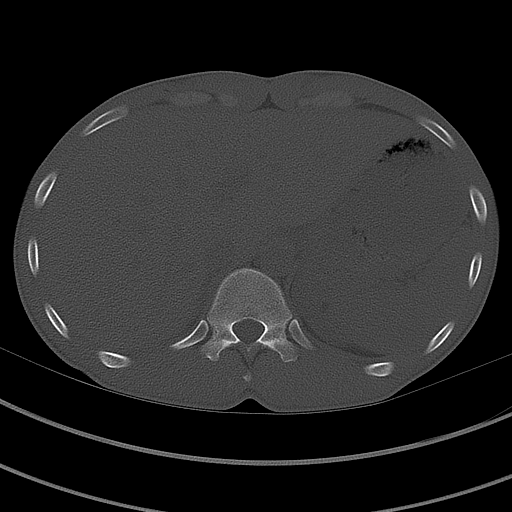
[im 10/20  soft-tissue]
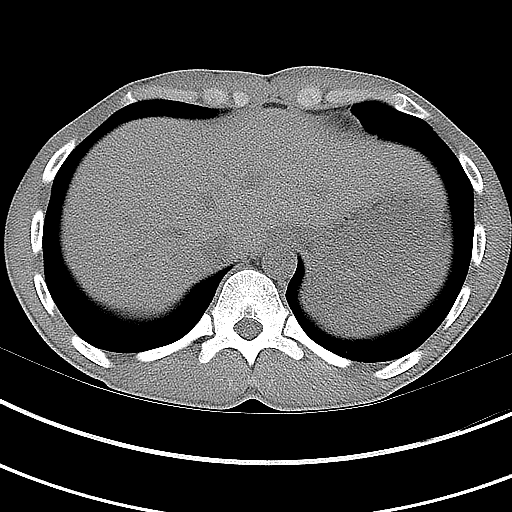
[im 10/20  lung]
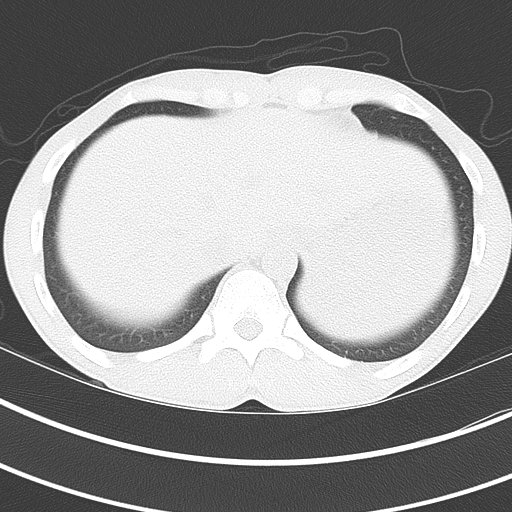
[im 15/20  soft-tissue]
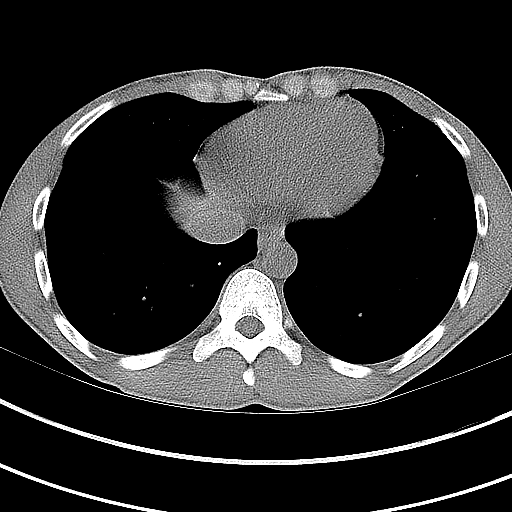
[im 15/20  lung]
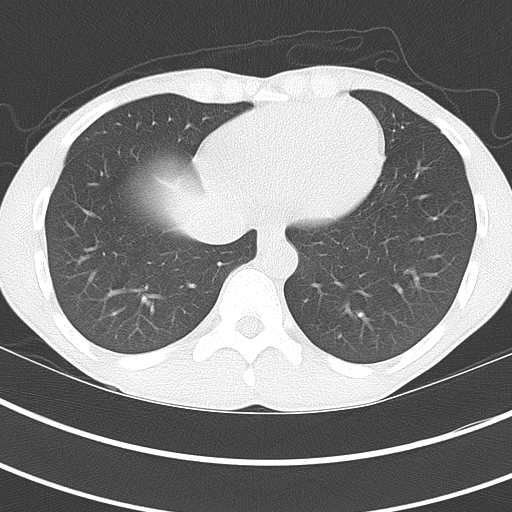

[Series 5: coronal · coronal · 0.74mm/px · 3 of 96 slices shown, 4 images]
[im 32/96  soft-tissue]
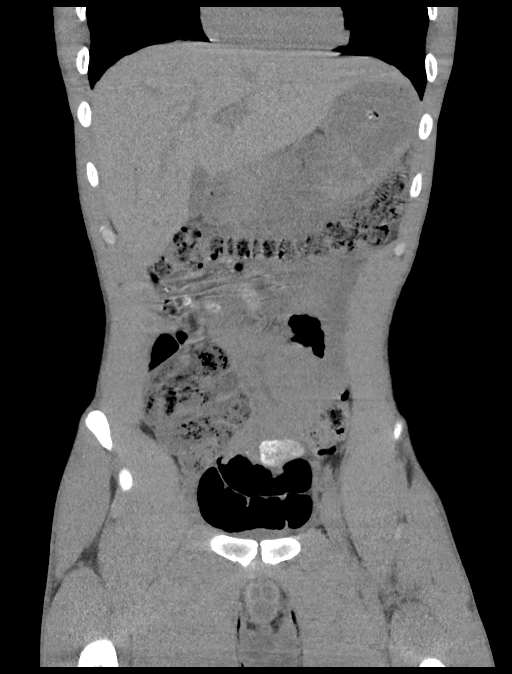
[im 43/96  soft-tissue]
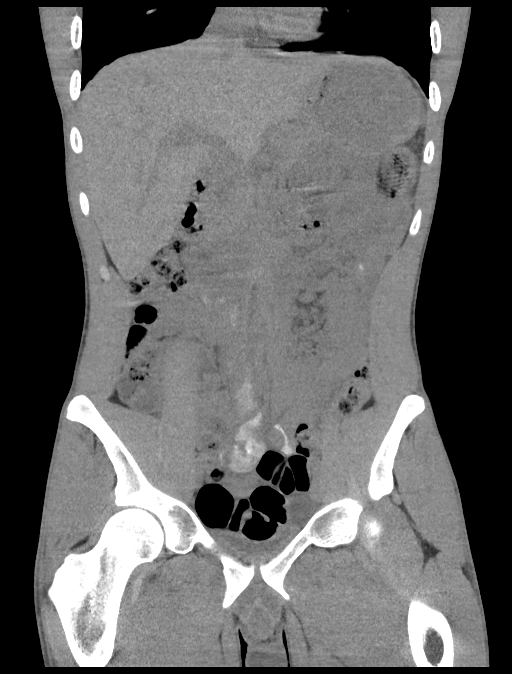
[im 43/96  bone]
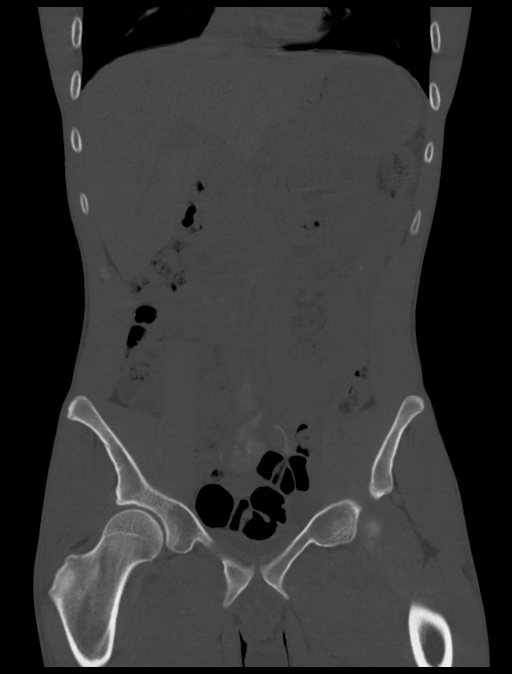
[im 53/96  soft-tissue]
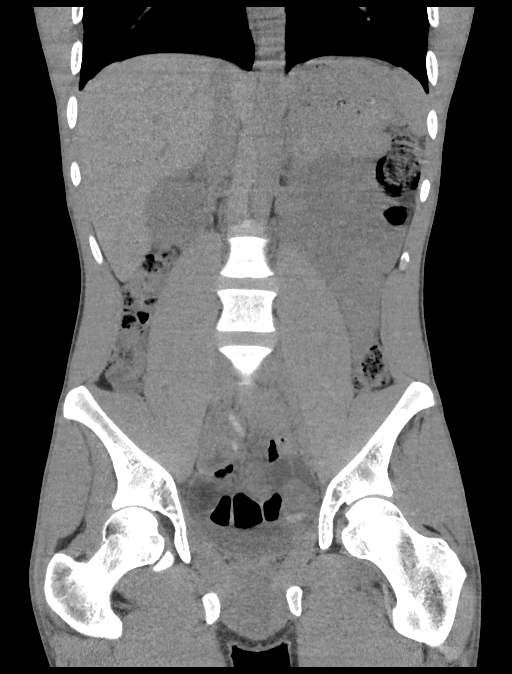

[Series 6: sagittal · sagittal · 0.48mm/px · 1 of 138 slices shown, 2 images]
[im 46/138  soft-tissue]
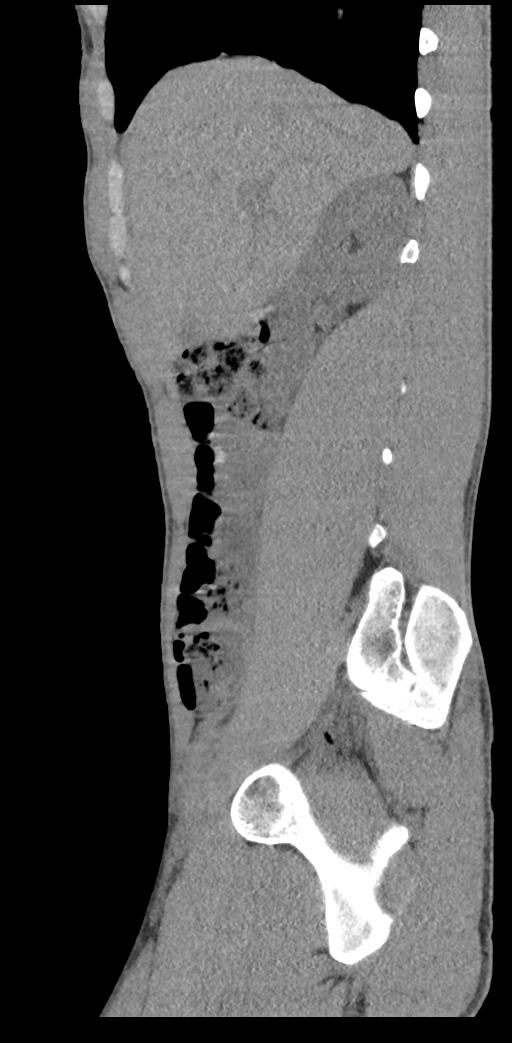
[im 46/138  bone]
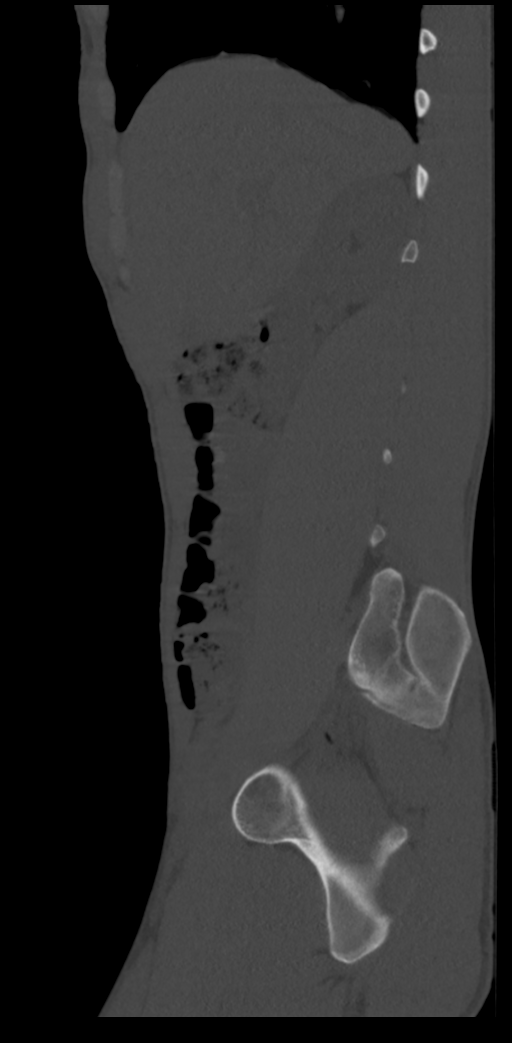

[7 of 46 positions shown; findings below may reference images not displayed]

FINDINGS: Lower chest: Negative

Hepatobiliary: Unenhanced images liver negative. Gallbladder
contracted without thickening. Bile ducts not well seen due to lack
of contrast. No definite biliary dilatation

Pancreas: Suboptimal visualization of pancreas due to lack of oral
and IV contrast. No evidence pancreatitis.

Spleen: Normal splenic size

Adrenals/Urinary Tract: Negative for renal obstruction. No renal
calculi or mass. Urinary bladder partially filled negative.

Stomach/Bowel: Limited bowel evaluation due to lack of oral and IV
contrast and lack of body fat. Negative for bowel obstruction.

Vascular/Lymphatic: Negative

Reproductive: Normal prostate.

Other: No free fluid

Musculoskeletal: Negative
IMPRESSION: Negative for renal calculi.  No acute abnormality.

Examination is limited due to lack of oral and IV contrast and lack
of body fat.

## 2020-06-14 ENCOUNTER — Encounter: Payer: Self-pay | Admitting: Emergency Medicine

## 2020-06-14 ENCOUNTER — Other Ambulatory Visit: Payer: Self-pay

## 2020-06-14 ENCOUNTER — Emergency Department
Admission: EM | Admit: 2020-06-14 | Discharge: 2020-06-14 | Disposition: A | Discharge status: Home / Self Care | Payer: Self-pay | Attending: Emergency Medicine | Admitting: Emergency Medicine

## 2020-06-14 ENCOUNTER — Emergency Department: Payer: Self-pay

## 2020-06-14 DIAGNOSIS — T887XXA Unspecified adverse effect of drug or medicament, initial encounter: Secondary | ICD-10-CM

## 2020-06-14 DIAGNOSIS — F1510 Other stimulant abuse, uncomplicated: Secondary | ICD-10-CM

## 2020-06-14 DIAGNOSIS — F17210 Nicotine dependence, cigarettes, uncomplicated: Secondary | ICD-10-CM | POA: Insufficient documentation

## 2020-06-14 DIAGNOSIS — F1590 Other stimulant use, unspecified, uncomplicated: Secondary | ICD-10-CM | POA: Insufficient documentation

## 2020-06-14 LAB — BASIC METABOLIC PANEL
Anion gap: 14 (ref 5–15)
BUN: 17 mg/dL (ref 6–20)
CO2: 25 mmol/L (ref 22–32)
Calcium: 10.1 mg/dL (ref 8.9–10.3)
Chloride: 102 mmol/L (ref 98–111)
Creatinine, Ser: 2.19 mg/dL — ABNORMAL HIGH (ref 0.61–1.24)
GFR calc Af Amer: 45 mL/min — ABNORMAL LOW (ref >60)
GFR calc non Af Amer: 39 mL/min — ABNORMAL LOW (ref >60)
Glucose, Bld: 96 mg/dL (ref 70–99)
Potassium: 4.1 mmol/L (ref 3.5–5.1)
Sodium: 141 mmol/L (ref 135–145)

## 2020-06-14 LAB — CBC
HCT: 48.2 % (ref 39.0–52.0)
Hemoglobin: 16 g/dL (ref 13.0–17.0)
MCH: 28.9 pg (ref 26.0–34.0)
MCHC: 33.2 g/dL (ref 30.0–36.0)
MCV: 87.2 fL (ref 80.0–100.0)
Platelets: 254 10*3/uL (ref 150–400)
RBC: 5.53 MIL/uL (ref 4.22–5.81)
RDW: 13.1 % (ref 11.5–15.5)
WBC: 13.8 10*3/uL — ABNORMAL HIGH (ref 4.0–10.5)
nRBC: 0 % (ref 0.0–0.2)

## 2020-06-14 LAB — TROPONIN I (HIGH SENSITIVITY): Troponin I (High Sensitivity): 8 ng/L (ref <18)

## 2020-06-14 NOTE — ED Triage Notes (Signed)
Started with chest pain and vomiting after trying meth for the first time today.  Also c/o dizziness.  Alert and oriented at this time. Unlabored. VSS

## 2020-06-14 NOTE — ED Notes (Signed)
See triage note stats he developed some chest pain and rapid heart rate today  States started after taking meth  States he is feeling better at present

## 2020-06-14 NOTE — ED Provider Notes (Signed)
[facility] Emergency Department Provider Note  ____________________________________________  Time seen: Approximately 4:40 PM  I have reviewed the triage vital signs and the nursing notes.   HISTORY  Chief Complaint Chest Pain    HPI Joshua [name] is a 29 y.o. male who presents the emergency department complaining of chest pain, palpitations, cold sweats, nausea, emesis after using methamphetamine.  Patient states that he used methamphetamines for the first time today, had an immediate reaction.  Patient states that this occurred around 4:00 in the morning but he went to work, worked all day and then presented to emergency department after his shift.  Patient states that for the most part symptoms have resolved with the exception of occasional palpitation type feeling.  Patient states that he is very anxious that he "may have really messed up."  Patient denies any headache, visual changes, chest pain, shortness of breath no abdominal pain, nausea vomiting currently.  Patient does have a history of bipolar disorder and schizophrenia, schizoaffective disorder.        Past Medical History:  Diagnosis Date  . Bipolar 1 disorder (HCC)   . Schizophrenia [facility])     Patient Active Problem List   Diagnosis Date Noted  . Schizoaffective disorder, bipolar type (HCC) 07/16/2016  . Cannabis use disorder, moderate, dependence (HCC) 07/12/2016  . Tobacco use disorder 07/12/2016  . Noncompliance 07/11/2016  . Alcohol use disorder, moderate, dependence (HCC)     History reviewed. No pertinent surgical history.  Prior to Admission medications   Medication Sig Start Date End Date Taking? Authorizing Provider  ARIPiprazole (ABILIFY) 30 MG tablet Take 1 tablet (30 mg total) by mouth daily. 03/15/18   Pucilowska, Jolanta B, MD  ARIPiprazole ER 400 MG SUSR Inject 400 mg into the muscle every 28 (twenty-eight) days. Next dose on 04/09/2018 03/15/18   Shari Prows, MD     Allergies Patient has no known allergies.  History reviewed. No pertinent family history.  Social History Social History   Tobacco Use  . Smoking status: Current Every Day Smoker    Packs/day: 1.00    Years: 5.00    Pack years: 5.00    Types: Cigarettes  . Smokeless tobacco: Never Used  Substance Use Topics  . Alcohol use: Not Currently    Alcohol/week: 20.0 standard drinks    Types: 20 Cans of beer per week    Comment: 12/22/16- states he drank "a lot last night"  . Drug use: Yes    Types: Marijuana     Review of Systems  Constitutional: No fever/chills Eyes: No visual changes. No discharge ENT: No upper respiratory complaints. Cardiovascular: Earlier patient experience chest pain, palpitations secondary to methamphetamine use.  Intermittent palpitations currently but no chest pain. Respiratory: no cough. No SOB. Gastrointestinal: No abdominal pain.  Patient previously had nausea and vomiting.  None currently.  No diarrhea.  No constipation. Musculoskeletal: Negative for musculoskeletal pain. Skin: Negative for rash, abrasions, lacerations, ecchymosis. Neurological: Negative for headaches, focal weakness or numbness. 10-point ROS otherwise negative.  ____________________________________________   PHYSICAL EXAM:  VITAL SIGNS: ED Triage Vitals [06/14/20 1421]  Enc Vitals Group     BP (!) 104/59     Pulse Rate (!) 108     Resp 20     Temp 98.5 F (36.9 C)     Temp Source Oral     SpO2 100 %     Weight 130 lb (59 kg)     Height 6\' 1"  (1.854 m)  Head Circumference      Peak Flow      Pain Score 7     Pain Loc      Pain Edu?      Excl. in GC?      Constitutional: Alert and oriented. Well appearing and in no acute distress. Eyes: Conjunctivae are normal. PERRL. EOMI. Head: Atraumatic. ENT:      Ears:       Nose: No congestion/rhinnorhea.      Mouth/Throat: Mucous membranes are moist.  Neck: No stridor.    Cardiovascular: Normal rate, regular  rhythm. Normal S1 and S2.  Good peripheral circulation. Respiratory: Normal respiratory effort without tachypnea or retractions. Lungs CTAB. Good air entry to the bases with no decreased or absent breath sounds. Gastrointestinal: Bowel sounds 4 quadrants. Soft and nontender to palpation. No guarding or rigidity. No palpable masses. No distention. No CVA tenderness. Musculoskeletal: Full range of motion to all extremities. No gross deformities appreciated. Neurologic:  Normal speech and language. No gross focal neurologic deficits are appreciated.  Skin:  Skin is warm, dry and intact. No rash noted. Psychiatric: Mood and affect are normal. Speech and behavior are normal. Patient exhibits appropriate insight and judgement.   ____________________________________________   LABS (all labs ordered are listed, but only abnormal results are displayed)  Labs Reviewed  BASIC METABOLIC PANEL - Abnormal; Notable for the following components:      Result Value   Creatinine, Ser 2.19 (*)    GFR calc non Af Amer 39 (*)    GFR calc Af Amer 45 (*)    All other components within normal limits  CBC - Abnormal; Notable for the following components:   WBC 13.8 (*)    All other components within normal limits  TROPONIN I (HIGH SENSITIVITY)  TROPONIN I (HIGH SENSITIVITY)   ____________________________________________  EKG  ED ECG REPORT I, Delorise Royals Maude Gloor,  personally viewed and interpreted this ECG.   Date: 06/14/2020  EKG Time: 1415 hrs.  Rate: 91 bpm  Rhythm: normal sinus rhythm, sinus arrhythmia  Axis: Normal axis  Intervals:none  ST&T Change: No ST elevation or depression noted  ____________________________________________  RADIOLOGY I personally viewed and evaluated these images as part of my medical decision making, as well as reviewing the written report by the radiologist.  DG Chest 2 View  Result Date: 06/14/2020 CLINICAL DATA:  Chest pain. Additional history provided:  Vomiting, dizziness. EXAM: CHEST - 2 VIEW COMPARISON:  Prior chest radiographs 09/12/2015 FINDINGS: Heart size within normal limits. There is no appreciable airspace consolidation. No evidence of pleural effusion or pneumothorax. No acute bony abnormality identified. IMPRESSION: No evidence of acute cardiopulmonary abnormality. Electronically Signed   By: Jackey Loge DO   On: 06/14/2020 14:57    ____________________________________________    PROCEDURES  Procedure(s) performed:    Procedures    Medications - No data to display   ____________________________________________   INITIAL IMPRESSION / ASSESSMENT AND PLAN / ED COURSE  Pertinent labs & imaging results that were available during my care of the patient were reviewed by me and considered in my medical decision making (see chart for details).  Review of the [place] CSRS was performed in accordance of the [facility] prior to dispensing any controlled drugs.           Patient's diagnosis is consistent with side effects of methamphetamines.  Patient presented to the emergency department complaining of increased anxiety, chest pain, cold sweats, palpitations, nausea vomiting after methamphetamine use.  Patient felt fine prior to methamphetamine use.  He is never used same in the past.  Symptoms began immediately after use and have been improving throughout the day.  Patient states that he still has some intermittent palpitations but no other symptoms currently.  Exam was reassuring..  Labs, EKG are reassuring at this time.  No evidence of heart damage/strain or STEMI.  At this time patient is improving symptomatically.  Ingestion was approximately 12 hours ago.  No further work-up or treatments at this time.  Patient states that he would never use methamphetamines again after side effect profile.  Follow-up primary care as needed.  Patient is given ED precautions to return to the ED for any worsening or new  symptoms.     ____________________________________________  FINAL CLINICAL IMPRESSION(S) / ED DIAGNOSES  Final diagnoses:  Methamphetamine use (HCC)  Side effect of drug      NEW MEDICATIONS STARTED DURING THIS VISIT:  ED Discharge Orders    None          This chart was dictated using voice recognition software/Dragon. Despite best efforts to proofread, errors can occur which can change the meaning. Any change was purely unintentional.    Racheal Patches, PA-C 06/14/20 1654    Darci Current, MD 06/14/20 2010

## 2020-10-04 ENCOUNTER — Emergency Department
Admission: EM | Admit: 2020-10-04 | Discharge: 2020-10-04 | Disposition: A | Discharge status: Home / Self Care | Payer: Medicaid Other | Attending: Emergency Medicine | Admitting: Emergency Medicine

## 2020-10-04 ENCOUNTER — Encounter: Payer: Self-pay | Admitting: Emergency Medicine

## 2020-10-04 ENCOUNTER — Other Ambulatory Visit: Payer: Self-pay

## 2020-10-04 DIAGNOSIS — F1910 Other psychoactive substance abuse, uncomplicated: Secondary | ICD-10-CM | POA: Insufficient documentation

## 2020-10-04 DIAGNOSIS — R0789 Other chest pain: Secondary | ICD-10-CM | POA: Insufficient documentation

## 2020-10-04 DIAGNOSIS — F17210 Nicotine dependence, cigarettes, uncomplicated: Secondary | ICD-10-CM | POA: Insufficient documentation

## 2020-10-04 DIAGNOSIS — R42 Dizziness and giddiness: Secondary | ICD-10-CM | POA: Insufficient documentation

## 2020-10-04 DIAGNOSIS — R112 Nausea with vomiting, unspecified: Secondary | ICD-10-CM | POA: Insufficient documentation

## 2020-10-04 LAB — CBC
HCT: 42.7 % (ref 39.0–52.0)
Hemoglobin: 14.6 g/dL (ref 13.0–17.0)
MCH: 29.3 pg (ref 26.0–34.0)
MCHC: 34.2 g/dL (ref 30.0–36.0)
MCV: 85.6 fL (ref 80.0–100.0)
Platelets: 272 10*3/uL (ref 150–400)
RBC: 4.99 MIL/uL (ref 4.22–5.81)
RDW: 13 % (ref 11.5–15.5)
WBC: 12.9 10*3/uL — ABNORMAL HIGH (ref 4.0–10.5)
nRBC: 0 % (ref 0.0–0.2)

## 2020-10-04 LAB — BASIC METABOLIC PANEL
Anion gap: 19 — ABNORMAL HIGH (ref 5–15)
BUN: 13 mg/dL (ref 6–20)
CO2: 22 mmol/L (ref 22–32)
Calcium: 9.6 mg/dL (ref 8.9–10.3)
Chloride: 95 mmol/L — ABNORMAL LOW (ref 98–111)
Creatinine, Ser: 1.03 mg/dL (ref 0.61–1.24)
GFR, Estimated: >60 mL/min (ref >60)
Glucose, Bld: 72 mg/dL (ref 70–99)
Potassium: 4.6 mmol/L (ref 3.5–5.1)
Sodium: 136 mmol/L (ref 135–145)

## 2020-10-04 LAB — TROPONIN I (HIGH SENSITIVITY): Troponin I (High Sensitivity): 4 ng/L (ref <18)

## 2020-10-04 NOTE — ED Triage Notes (Signed)
Pt comes into the ED via POV c/o possible drug overdose of crack cocaine.  Pt c/o numbness, tingling, and nausea.  Pt states he took more than normal d/t full day binge.  Pt also states he consumed alcohol as well.  Pt has even and unlabored respirations at this time and is in NAD.  Pt ambulatory to triage.,

## 2020-10-04 NOTE — ED Provider Notes (Signed)
[facility] Emergency Department Provider Note   ____________________________________________    I have reviewed the triage vital signs and the nursing notes.   HISTORY  Chief Complaint Drug Overdose     HPI Joshua [name] is a 29 y.o. male with a history of substance abuse, bipolar disorder, schizophrenia who presents after drug abuse.  He is concerned that he may have accidentally overdosed on crack cocaine.  He reports he felt dizzy and had nausea and vomiting as well as some chest discomfort.  Currently is feeling much better and has no complaints.  He no longer has chest pain.  Also admits to using alcohol.  Past Medical History:  Diagnosis Date  . Bipolar 1 disorder (HCC)   . Schizophrenia [facility])     Patient Active Problem List   Diagnosis Date Noted  . Schizoaffective disorder, bipolar type (HCC) 07/16/2016  . Cannabis use disorder, moderate, dependence (HCC) 07/12/2016  . Tobacco use disorder 07/12/2016  . Noncompliance 07/11/2016  . Alcohol use disorder, moderate, dependence (HCC)     History reviewed. No pertinent surgical history.  Prior to Admission medications   Medication Sig Start Date End Date Taking? Authorizing Provider  ARIPiprazole (ABILIFY) 30 MG tablet Take 1 tablet (30 mg total) by mouth daily. 03/15/18   Pucilowska, Jolanta B, MD  ARIPiprazole ER 400 MG SUSR Inject 400 mg into the muscle every 28 (twenty-eight) days. Next dose on 04/09/2018 03/15/18   Shari Prows, MD     Allergies Patient has no known allergies.  History reviewed. No pertinent family history.  Social History Social History   Tobacco Use  . Smoking status: Current Every Day Smoker    Packs/day: 1.00    Years: 5.00    Pack years: 5.00    Types: Cigarettes  . Smokeless tobacco: Never Used  Substance Use Topics  . Alcohol use: Not Currently    Alcohol/week: 20.0 standard drinks    Types: 20 Cans of beer per week    Comment: 12/22/16-  states he drank "a lot last night"  . Drug use: Yes    Types: Marijuana    Comment: crack, cocaine    Review of Systems  Constitutional: No fever/chills Eyes: No visual changes.  ENT: No sore throat. Cardiovascular: As above Respiratory: Denies shortness of breath. Gastrointestinal: No abdominal pain.  As above Genitourinary: Negative for dysuria. Musculoskeletal: Negative for back pain. Skin: Negative for rash. Neurological: Negative for headaches or weakness   ____________________________________________   PHYSICAL EXAM:  VITAL SIGNS: ED Triage Vitals [10/04/20 1641]  Enc Vitals Group     BP (!) 160/89     Pulse Rate 86     Resp 18     Temp 98.1 F (36.7 C)     Temp Source Oral     SpO2 100 %     Weight 61.2 kg (135 lb)     Height 1.854 m (6\' 1" )     Head Circumference      Peak Flow      Pain Score 6     Pain Loc      Pain Edu?      Excl. in GC?     Constitutional: Alert and oriented.  Nose: No congestion/rhinnorhea.  Cardiovascular: Normal rate, regular rhythm. Grossly normal heart sounds.  Good peripheral circulation. Respiratory: Normal respiratory effort.  No retractions. Lungs CTAB. Gastrointestinal: Soft and nontender. No distention.  No CVA tenderness. Genitourinary: deferred Musculoskeletal: No lower extremity tenderness nor  edema.  Warm and well perfused Neurologic:  Normal speech and language. No gross focal neurologic deficits are appreciated.  Skin:  Skin is warm, dry and intact. No rash noted. Psychiatric: Mood and affect are normal. Speech and behavior are normal.  ____________________________________________   LABS (all labs ordered are listed, but only abnormal results are displayed)  Labs Reviewed  BASIC METABOLIC PANEL - Abnormal; Notable for the following components:      Result Value   Chloride 95 (*)    Anion gap 19 (*)    All other components within normal limits  CBC - Abnormal; Notable for the following components:   WBC  12.9 (*)    All other components within normal limits  TROPONIN I (HIGH SENSITIVITY)   ____________________________________________  EKG  ED ECG REPORT I, Jene Every, the attending physician, personally viewed and interpreted this ECG.  Date: 10/04/2020  Rhythm: normal sinus rhythm QRS Axis: normal Intervals: normal ST/T Wave abnormalities: normal Narrative Interpretation: no evidence of acute ischemia  ____________________________________________  RADIOLOGY  None ____________________________________________   PROCEDURES  Procedure(s) performed: No  Procedures   Critical Care performed: No ____________________________________________   INITIAL IMPRESSION / ASSESSMENT AND PLAN / ED COURSE  Pertinent labs & imaging results that were available during my care of the patient were reviewed by me and considered in my medical decision making (see chart for details).  Patient presents with chest discomfort, nausea vomiting in the setting of taking cocaine.  He reports feeling significantly improved now.  No further nausea, no further chest pain.  Lab work notable for mild elevation white blood cell count which is nonspecific.  EKG is reassuring.  Given his presentation I recommended that we add a troponin onto his labs however the patient said that if his EKG was okay he needs to go because his grandmother is waiting to take him home and she cannot drive after dark.  Patient was unwilling to stay for troponin result I did notify him that elevated troponin could lead to morbidity mortality.    ____________________________________________   FINAL CLINICAL IMPRESSION(S) / ED DIAGNOSES  Final diagnoses:  Polysubstance abuse (HCC)        Note:  This document was prepared using Dragon voice recognition software and may include unintentional dictation errors.   Jene Every, MD 10/04/20 2003

## 2021-02-18 ENCOUNTER — Other Ambulatory Visit: Payer: Self-pay

## 2021-02-18 ENCOUNTER — Emergency Department
Admission: EM | Admit: 2021-02-18 | Discharge: 2021-02-18 | Disposition: A | Discharge status: Home / Self Care | Payer: Medicaid Other | Attending: Emergency Medicine | Admitting: Emergency Medicine

## 2021-02-18 DIAGNOSIS — F1910 Other psychoactive substance abuse, uncomplicated: Secondary | ICD-10-CM

## 2021-02-18 DIAGNOSIS — Z20822 Contact with and (suspected) exposure to covid-19: Secondary | ICD-10-CM | POA: Insufficient documentation

## 2021-02-18 DIAGNOSIS — F17210 Nicotine dependence, cigarettes, uncomplicated: Secondary | ICD-10-CM | POA: Insufficient documentation

## 2021-02-18 LAB — CBC
HCT: 45.3 % (ref 39.0–52.0)
Hemoglobin: 15.1 g/dL (ref 13.0–17.0)
MCH: 29.5 pg (ref 26.0–34.0)
MCHC: 33.3 g/dL (ref 30.0–36.0)
MCV: 88.6 fL (ref 80.0–100.0)
Platelets: 240 10*3/uL (ref 150–400)
RBC: 5.11 MIL/uL (ref 4.22–5.81)
RDW: 13.2 % (ref 11.5–15.5)
WBC: 7.3 10*3/uL (ref 4.0–10.5)
nRBC: 0 % (ref 0.0–0.2)

## 2021-02-18 LAB — URINE DRUG SCREEN, QUALITATIVE (ARMC ONLY)
Amphetamines, Ur Screen: NOT DETECTED
Barbiturates, Ur Screen: NOT DETECTED
Benzodiazepine, Ur Scrn: NOT DETECTED
Cannabinoid 50 Ng, Ur ~~LOC~~: POSITIVE — AB
Cocaine Metabolite,Ur ~~LOC~~: POSITIVE — AB
MDMA (Ecstasy)Ur Screen: NOT DETECTED
Methadone Scn, Ur: NOT DETECTED
Opiate, Ur Screen: NOT DETECTED
Phencyclidine (PCP) Ur S: NOT DETECTED
Tricyclic, Ur Screen: NOT DETECTED

## 2021-02-18 LAB — RESP PANEL BY RT-PCR (FLU A&B, COVID) ARPGX2
Influenza A by PCR: NEGATIVE
Influenza B by PCR: NEGATIVE
SARS Coronavirus 2 by RT PCR: NEGATIVE

## 2021-02-18 LAB — COMPREHENSIVE METABOLIC PANEL
ALT: 20 U/L (ref 0–44)
AST: 29 U/L (ref 15–41)
Albumin: 5.2 g/dL — ABNORMAL HIGH (ref 3.5–5.0)
Alkaline Phosphatase: 50 U/L (ref 38–126)
Anion gap: 13 (ref 5–15)
BUN: 9 mg/dL (ref 6–20)
CO2: 22 mmol/L (ref 22–32)
Calcium: 9.3 mg/dL (ref 8.9–10.3)
Chloride: 104 mmol/L (ref 98–111)
Creatinine, Ser: 0.8 mg/dL (ref 0.61–1.24)
GFR, Estimated: >60 mL/min (ref >60)
Glucose, Bld: 83 mg/dL (ref 70–99)
Potassium: 4 mmol/L (ref 3.5–5.1)
Sodium: 139 mmol/L (ref 135–145)
Total Bilirubin: 1 mg/dL (ref 0.3–1.2)
Total Protein: 8.2 g/dL — ABNORMAL HIGH (ref 6.5–8.1)

## 2021-02-18 LAB — ACETAMINOPHEN LEVEL: Acetaminophen (Tylenol), Serum: <10 ug/mL — ABNORMAL LOW (ref 10–30)

## 2021-02-18 LAB — SALICYLATE LEVEL: Salicylate Lvl: <7 mg/dL — ABNORMAL LOW (ref 7.0–30.0)

## 2021-02-18 LAB — ETHANOL: Alcohol, Ethyl (B): 269 mg/dL — ABNORMAL HIGH (ref <10)

## 2021-02-18 NOTE — ED Notes (Signed)
When asked CSSRS questions, patient states that he has SI and HI. Patient states "I want to shoot someone and have the police shoot me". Patient became tearful when speaking about it.  Patient given snack and water and blanket.
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
## 2021-02-18 NOTE — ED Notes (Signed)
Patient asleep at time of TTS consult. Patient states he is very sleepy and would like to remain un-bothered until "later".

## 2021-02-18 NOTE — ED Notes (Signed)
Patient refused DC VS and disposition questions.

## 2021-02-18 NOTE — ED Provider Notes (Signed)
[facility] Emergency Department Provider Note  Time seen: 4:16 PM  I have reviewed the triage vital signs and the nursing notes.   HISTORY  Chief Complaint Mental Health Problem   HPI Joshua [name] is a 30 y.o. male with a past medical history of bipolar, schizophrenia, substance abuse, presents to the emergency department to get back on his medications.  According to the patient he was taking Abilify previously but he has been off this medication for 2 to 3 years.  Patient states he has been feeling more angry and agitated recently and feels the Abilify would help him from a mental health perspective.  Patient also admits he has been using substances recently such as cocaine marijuana and alcohol.  Admits last alcohol use was earlier today.  Patient denies any SI or HI.  No medical complaints.  Past Medical History:  Diagnosis Date  . Bipolar 1 disorder (HCC)   . Schizophrenia [facility])     Patient Active Problem List   Diagnosis Date Noted  . Schizoaffective disorder, bipolar type (HCC) 07/16/2016  . Cannabis use disorder, moderate, dependence (HCC) 07/12/2016  . Tobacco use disorder 07/12/2016  . Noncompliance 07/11/2016  . Alcohol use disorder, moderate, dependence (HCC)     History reviewed. No pertinent surgical history.  Prior to Admission medications   Medication Sig Start Date End Date Taking? Authorizing Provider  ARIPiprazole (ABILIFY) 30 MG tablet Take 1 tablet (30 mg total) by mouth daily. 03/15/18   Pucilowska, Jolanta B, MD  ARIPiprazole ER 400 MG SUSR Inject 400 mg into the muscle every 28 (twenty-eight) days. Next dose on 04/09/2018 03/15/18   Pucilowska, Ellin Goodie, MD    No Known Allergies  No family history on file.  Social History Social History   Tobacco Use  . Smoking status: Current Every Day Smoker    Packs/day: 1.00    Years: 5.00    Pack years: 5.00    Types: Cigarettes  . Smokeless tobacco: Never Used  Substance Use  Topics  . Alcohol use: Not Currently    Alcohol/week: 20.0 standard drinks    Types: 20 Cans of beer per week    Comment: 12/22/16- states he drank "a lot last night"  . Drug use: Yes    Types: Marijuana    Comment: crack, cocaine    Review of Systems Constitutional: Negative for fever. Cardiovascular: Negative for chest pain. Respiratory: Negative for shortness of breath. Gastrointestinal: Negative for abdominal pain Musculoskeletal: Negative for musculoskeletal complaints Skin: Negative for skin complaints  Neurological: Negative for headache All other ROS negative  ____________________________________________   PHYSICAL EXAM:  VITAL SIGNS: ED Triage Vitals  Enc Vitals Group     BP 02/18/21 1441 137/88     Pulse Rate 02/18/21 1441 91     Resp 02/18/21 1441 18     Temp 02/18/21 1441 98.4 F (36.9 C)     Temp Source 02/18/21 1441 Oral     SpO2 02/18/21 1441 98 %     Weight 02/18/21 1442 135 lb (61.2 kg)     Height 02/18/21 1442 6\' 1"  (1.854 m)     Head Circumference --      Peak Flow --      Pain Score 02/18/21 1442 0     Pain Loc --      Pain Edu? --      Excl. in GC? --     Constitutional: Alert and oriented. Well appearing and in no distress. Eyes:  Normal exam ENT      Head: Normocephalic and atraumatic.      Mouth/Throat: Mucous membranes are moist. Cardiovascular: Normal rate, regular rhythm. Respiratory: Normal respiratory effort without tachypnea nor retractions. Breath sounds are clear  Gastrointestinal: Soft and nontender. No distention.  Musculoskeletal: Nontender with normal range of motion in all extremities.  Neurologic:  Normal speech and language. No gross focal neurologic deficits Skin:  Skin is warm, dry and intact.  Psychiatric: Mood and affect are normal.   ____________________________________________   INITIAL IMPRESSION / ASSESSMENT AND PLAN / ED COURSE  Pertinent labs & imaging results that were available during my care of the patient  were reviewed by me and considered in my medical decision making (see chart for details).   Patient presents to the emergency department for evaluation hoping to get back on his Abilify medication.  According to the patient he has been off of his psychiatric medications for the past 2 to 3 years and believes it would help with his anger and agitation issues.  Patient is currently calm cooperative.  He has no medical complaints.  Patient's lab work does show an elevated alcohol level of 269 as well as a cannabinoid and cocaine positive urine toxicology.  We will have psychiatry and TTS evaluate.  Does not meet IVC criteria at this time.  Patient no longer wishes to wait for psychiatrist.  We will discharge the patient home.  Joshua [name] was evaluated in Emergency Department on 02/18/2021 for the symptoms described in the history of present illness. He was evaluated in the context of the global COVID-19 pandemic, which necessitated consideration that the patient might be at risk for infection with the SARS-CoV-2 virus that causes COVID-19. Institutional protocols and algorithms that pertain to the evaluation of patients at risk for COVID-19 are in a state of rapid change based on information released by regulatory bodies including the CDC and federal and state organizations. These policies and algorithms were followed during the patient's care in the ED.  ____________________________________________   FINAL CLINICAL IMPRESSION(S) / ED DIAGNOSES  Substance use Psychiatric evaluation   Minna Antis, MD 02/18/21 1751

## 2021-02-18 NOTE — BH Assessment (Signed)
TTS attempted to complete assessment but was unsuccessful due to pt being unable to be aroused. TTS will follow up accordingly.
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
## 2021-02-18 NOTE — ED Notes (Signed)
Report received from [place], [place]  Patient currently in hallway bed. Patient given phone for brief phone call.

## 2021-02-18 NOTE — ED Triage Notes (Addendum)
Pt states he is here to go to behavioral health d/t wanting to get back on his medication - pt states he was taking abilify for bipolar depression- pt states he has been off his medication for years- pt making poor eye contact- pt concerned about remaining voluntary- pt smells strongly of marijuana- pt states he was drinking alcohol approximately 1 hour ago and was drinking beer- pt states he smoked "a lot of crack yesterday"

## 2021-02-18 NOTE — ED Notes (Signed)
Patient refused discharge VS.

## 2021-10-05 ENCOUNTER — Other Ambulatory Visit: Payer: Self-pay

## 2021-10-05 ENCOUNTER — Emergency Department
Admission: EM | Admit: 2021-10-05 | Discharge: 2021-10-06 | Disposition: A | Discharge status: Home / Self Care | Payer: Medicaid Other | Attending: Emergency Medicine | Admitting: Emergency Medicine

## 2021-10-05 DIAGNOSIS — F17210 Nicotine dependence, cigarettes, uncomplicated: Secondary | ICD-10-CM | POA: Insufficient documentation

## 2021-10-05 DIAGNOSIS — F3289 Other specified depressive episodes: Secondary | ICD-10-CM

## 2021-10-05 DIAGNOSIS — F1010 Alcohol abuse, uncomplicated: Secondary | ICD-10-CM

## 2021-10-05 DIAGNOSIS — F10180 Alcohol abuse with alcohol-induced anxiety disorder: Secondary | ICD-10-CM | POA: Insufficient documentation

## 2021-10-05 DIAGNOSIS — F419 Anxiety disorder, unspecified: Secondary | ICD-10-CM

## 2021-10-05 DIAGNOSIS — F259 Schizoaffective disorder, unspecified: Secondary | ICD-10-CM | POA: Insufficient documentation

## 2021-10-05 LAB — CBC
HCT: 47.3 % (ref 39.0–52.0)
Hemoglobin: 16.5 g/dL (ref 13.0–17.0)
MCH: 30.7 pg (ref 26.0–34.0)
MCHC: 34.9 g/dL (ref 30.0–36.0)
MCV: 88.1 fL (ref 80.0–100.0)
Platelets: 304 10*3/uL (ref 150–400)
RBC: 5.37 MIL/uL (ref 4.22–5.81)
RDW: 12.8 % (ref 11.5–15.5)
WBC: 6.4 10*3/uL (ref 4.0–10.5)
nRBC: 0 % (ref 0.0–0.2)

## 2021-10-05 LAB — COMPREHENSIVE METABOLIC PANEL
ALT: 17 U/L (ref 0–44)
AST: 25 U/L (ref 15–41)
Albumin: 4.6 g/dL (ref 3.5–5.0)
Alkaline Phosphatase: 58 U/L (ref 38–126)
Anion gap: 9 (ref 5–15)
BUN: 9 mg/dL (ref 6–20)
CO2: 25 mmol/L (ref 22–32)
Calcium: 9.6 mg/dL (ref 8.9–10.3)
Chloride: 102 mmol/L (ref 98–111)
Creatinine, Ser: 0.9 mg/dL (ref 0.61–1.24)
GFR, Estimated: >60 mL/min (ref >60)
Glucose, Bld: 107 mg/dL — ABNORMAL HIGH (ref 70–99)
Potassium: 4.1 mmol/L (ref 3.5–5.1)
Sodium: 136 mmol/L (ref 135–145)
Total Bilirubin: 1.6 mg/dL — ABNORMAL HIGH (ref 0.3–1.2)
Total Protein: 7.8 g/dL (ref 6.5–8.1)

## 2021-10-05 NOTE — ED Provider Notes (Signed)
[facility] Emergency Department Provider Note ____________________________________________   Event Date/Time   First MD Initiated Contact with Patient 10/05/21 2255     (approximate)  I have reviewed the triage vital signs and the nursing notes.  HISTORY  Chief Complaint Anxiety and Depression   HPI Joshua [name] is a 30 y.o. malewho presents to the ED for evaluation of his mental health.  Chart review indicates polysubstance abuse, schizophrenia and bipolar disorder. Patient reports he is not currently taking any prescription medications.  Patient presents to the ED voluntarily for evaluation of his mental health, requesting assistance for ethanol abuse, depression and anxiety.  He reports a longstanding history of alcohol abuse over the past decade, that has been waxing and waning.  He reports up to 3 months of sobriety at a time, but he always goes back to drinking beers and binging ethanol.  He reports that he will binge over 3-4 days, blackout, and not recall what happens.  He reports being arrested in the past in these circumstances.  He seems to have pretty good insight to his present condition.  He reports underlying difficulties with his mental health, coping with ethanol, and going through a cyclic battle with this.  He has lost multiple jobs in his house, now living with family.  He reports his last drink was 3 days ago and realizing that he needs to get some help. No current suicidal intent, hallucinations.  No additional coingestions beyond ethanol.  No recent illnesses.  Past Medical History:  Diagnosis Date   Bipolar 1 disorder (HCC)    Schizophrenia [facility])     Patient Active Problem List   Diagnosis Date Noted   Schizoaffective disorder, bipolar type (HCC) 07/16/2016   Cannabis use disorder, moderate, dependence (HCC) 07/12/2016   Tobacco use disorder 07/12/2016   Noncompliance 07/11/2016   Alcohol use disorder, moderate, dependence  (HCC)     No past surgical history on file.  Prior to Admission medications   Medication Sig Start Date End Date Taking? Authorizing Provider  ARIPiprazole (ABILIFY) 30 MG tablet Take 1 tablet (30 mg total) by mouth daily. 03/15/18   Pucilowska, Jolanta B, MD  ARIPiprazole ER 400 MG SUSR Inject 400 mg into the muscle every 28 (twenty-eight) days. Next dose on 04/09/2018 03/15/18   Shari Prows, MD    Allergies Patient has no known allergies.  No family history on file.  Social History Social History   Tobacco Use   Smoking status: Every Day    Packs/day: 1.00    Years: 5.00    Pack years: 5.00    Types: Cigarettes   Smokeless tobacco: Never  Substance Use Topics   Alcohol use: Not Currently    Alcohol/week: 20.0 standard drinks    Types: 20 Cans of beer per week    Comment: 12/22/16- states he drank "a lot last night"   Drug use: Yes    Types: Marijuana    Comment: crack, cocaine    Review of Systems  Constitutional: No fever/chills Eyes: No visual changes. ENT: No sore throat. Cardiovascular: Denies chest pain. Respiratory: Denies shortness of breath. Gastrointestinal: No abdominal pain.  No nausea, no vomiting.  No diarrhea.  No constipation. Genitourinary: Negative for dysuria. Musculoskeletal: Negative for back pain. Skin: Negative for rash. Neurological: Negative for headaches, focal weakness or numbness. ____________________________________________   PHYSICAL EXAM:  VITAL SIGNS: Vitals:   10/05/21 1403 10/05/21 2248  BP: 130/90 113/66  Pulse: 85 64  Resp: 16  18  Temp: 98.6 F (37 C)   SpO2: 100% 98%     Constitutional: Alert and oriented. Well appearing and in no acute distress. Eyes: Conjunctivae are normal. PERRL. EOMI. Head: Atraumatic. Nose: No congestion/rhinnorhea. Mouth/Throat: Mucous membranes are moist.  Oropharynx non-erythematous. Neck: No stridor. No cervical spine tenderness to palpation. Cardiovascular: Normal rate, regular  rhythm. Grossly normal heart sounds.  Good peripheral circulation. Respiratory: Normal respiratory effort.  No retractions. Lungs CTAB. Gastrointestinal: Soft , nondistended, nontender to palpation. No CVA tenderness. Musculoskeletal: No lower extremity tenderness nor edema.  No joint effusions. No signs of acute trauma. Neurologic:  Normal speech and language. No gross focal neurologic deficits are appreciated. No gait instability noted. Skin:  Skin is warm, dry and intact. No rash noted. Psychiatric: Mood and affect are normal. Speech and behavior are normal. ____________________________________________   LABS (all labs ordered are listed, but only abnormal results are displayed)  Labs Reviewed  COMPREHENSIVE METABOLIC PANEL - Abnormal; Notable for the following components:      Result Value   Glucose, Bld 107 (*)    Total Bilirubin 1.6 (*)    All other components within normal limits  CBC  URINE DRUG SCREEN, QUALITATIVE ([facility] ONLY)  ACETAMINOPHEN LEVEL  ETHANOL  SALICYLATE LEVEL   ____________________________________________  12 Lead EKG   ____________________________________________  RADIOLOGY  ED MD interpretation:    Official radiology report(s): No results found.  ____________________________________________   PROCEDURES and INTERVENTIONS  Procedure(s) performed (including Critical Care):  Procedures  Medications  LORazepam (ATIVAN) tablet 1 mg (has no administration in time range)    ____________________________________________   MDM / ED COURSE   30 year old male presents to the ED voluntarily for evaluation of his mental health and ethanol abuse, requiring psychiatric evaluation.  No evidence of medical pathology to preclude psychiatric evaluation and disposition.  He has quite good insight into his condition and is here voluntarily, without indication for IVC.  No evidence of alcohol withdrawals or DTs.  We will provide single dose of oral Ativan due  to his anxiety and discussed the case with TTS and psychiatry.   Clinical Course as of 10/06/21 0101  Sat Oct 06, 2021  [id number] The patient has been placed in psychiatric observation due to the need to provide a safe environment for the patient while obtaining psychiatric consultation and evaluation, as well as ongoing medical and medication management to treat the patient's condition.  The patient has not been placed under full IVC at this time.   [DS]    Clinical Course User Index [DS] Delton Prairie, MD    ____________________________________________   FINAL CLINICAL IMPRESSION(S) / ED DIAGNOSES  Final diagnoses:  Alcohol abuse     ED Discharge Orders     None        Tomislav Micale Katrinka Blazing   Note:  This document was prepared using Dragon voice recognition software and may include unintentional dictation errors.    Delton Prairie, MD 10/06/21 [id number]

## 2021-10-05 NOTE — ED Triage Notes (Signed)
Pt states that his anxiety and depression have been out of control lately, pt states that he has racing thoughts all the time, pt states that he has also dealt with alcohol abuse and thinks that he may be using alcohol to cope with his mental health and thinks he needs to be evaluated, states that he was evaluated years ago and placed on medications but states that he never really gave them a chance to work
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
## 2021-10-06 MED ORDER — LORAZEPAM 1 MG PO TABS
1.0000 mg | ORAL_TABLET | Freq: Once | ORAL | Status: AC
  Administered 2021-10-06: 1 mg via ORAL
  Filled 2021-10-06: qty 1

## 2021-10-06 MED ORDER — NICOTINE 21 MG/24HR TD PT24
21.0000 mg | MEDICATED_PATCH | Freq: Once | TRANSDERMAL | Status: DC
  Administered 2021-10-06: 21 mg via TRANSDERMAL
  Filled 2021-10-06: qty 1

## 2021-10-06 NOTE — ED Provider Notes (Signed)
Patient is waiting for psychiatry but we do not have any today may not have any tomorrow.  He is not homicidal or suicidal and wishes to go.  He feels fine.  Is talking to his mom on the phone.  He knows about [facility] and will go there.   Arnaldo Natal, MD 10/06/21 1210

## 2021-10-06 NOTE — ED Notes (Signed)
VOL/Psych Consult Pending  

## 2021-10-06 NOTE — Discharge Instructions (Signed)
Please follow-up with [facility].  I have given you the contact information for them on the other sheet of paper.  Please return if you have any further problems.  I am very sorry we did not have a psychiatrist to can talk to you today.

## 2021-10-06 NOTE — ED Notes (Signed)
Breakfast tray was given with juice. 

## 2021-10-06 NOTE — BH Assessment (Signed)
Comprehensive Clinical Assessment (CCA) Note  10/06/2021 Joshua [name] [id number]  Chief Complaint: Patient is a 30 year old male presenting to [facility] ED voluntarily. Per triage note Pt states that his anxiety and depression have been out of control lately, pt states that he has racing thoughts all the time, pt states that he has also dealt with alcohol abuse and thinks that he may be using alcohol to cope with his mental health and thinks he needs to be evaluated, states that he was evaluated years ago and placed on medications but states that he never really gave them a chance to work. During assessment patient appears alert and oriented x4, calm and cooperative. Patient reports "I'm trying to get back on my medications, I've been struggling and caving in with alcohol." "I have racking thoughts, depression and I'm just ready to get on my medications." "I keep relapsing and losing my temper with people." Patient reports that he was previously on Abilify and was admitted to [facility] in the past. Patient reports that he is a binge drinker "I can for 3 months without it but when I pick it up I can go for 4 days straight" and reports his age of first use at 66. Patient denies current SI/HI/AH/VH and does not appear to be responding to any internal or external stimuli. Chief Complaint  Patient presents with   Anxiety   Depression   Visit Diagnosis: Schizoaffective disorder, bipolar type by hx. Alcohol abuse    CCA Screening, Triage and Referral (STR)  Patient Reported Information How did you hear about [place]? Self  Referral name: No data recorded Referral phone number: No data recorded  Whom do you see for routine medical problems? No data recorded Practice/Facility Name: No data recorded Practice/Facility Phone Number: No data recorded Name of Contact: No data recorded Contact Number: No data recorded Contact Fax Number: No data recorded Prescriber Name: No data recorded Prescriber Address  (if known): No data recorded  What Is the Reason for Your Visit/Call Today? Patient presents voluntarily to get back on his medication  How Long Has This Been Causing You Problems? > than 6 months  What Do You Feel Would Help You the Most Today? Treatment for Depression or other mood problem   Have You Recently Been in Any Inpatient Treatment (Hospital/Detox/Crisis Center/28-Day Program)? No data recorded Name/Location of Program/Hospital:No data recorded How Long Were You There? No data recorded When Were You Discharged? No data recorded  Have You Ever Received Services From [facility] Before? No data recorded Who Do You See at [facility]? No data recorded  Have You Recently Had Any Thoughts About Hurting Yourself? No  Are You Planning to Commit Suicide/Harm Yourself At This time? No   Have you Recently Had Thoughts About Hurting Someone Karolee Ohs? No  Explanation: No data recorded  Have You Used Any Alcohol or Drugs in the Past 24 Hours? Yes  How Long Ago Did You Use Drugs or Alcohol? No data recorded What Did You Use and How Much? Alcohol, Unknow amounts, patient is a binge drinker   Do You Currently Have a Therapist/Psychiatrist? No  Name of Therapist/Psychiatrist: No data recorded  Have You Been Recently Discharged From Any Office Practice or Programs? No  Explanation of Discharge From Practice/Program: No data recorded    CCA Screening Triage Referral Assessment Type of Contact: Face-to-Face  Is this Initial or Reassessment? No data recorded Date Telepsych consult ordered in [facility]:  No data recorded Time Telepsych consult ordered  in [facility]:  No data recorded  Patient Reported Information Reviewed? No data recorded Patient Left Without Being Seen? No data recorded Reason for Not Completing Assessment: No data recorded  Collateral Involvement: No data recorded  Does Patient Have a Court Appointed Legal Guardian? No data recorded Name and Contact of Legal Guardian:  No data recorded If Minor and Not Living with Parent(s), Who has Custody? No data recorded Is CPS involved or ever been involved? Never  Is APS involved or ever been involved? Never   Patient Determined To Be At Risk for Harm To Self or Others Based on Review of Patient Reported Information or Presenting Complaint? No  Method: No data recorded Availability of Means: No data recorded Intent: No data recorded Notification Required: No data recorded Additional Information for Danger to Others Potential: No data recorded Additional Comments for Danger to Others Potential: No data recorded Are There Guns or Other Weapons in Your Home? No data recorded Types of Guns/Weapons: No data recorded Are These Weapons Safely Secured?                            No data recorded Who Could Verify You Are Able To Have These Secured: No data recorded Do You Have any Outstanding Charges, Pending Court Dates, Parole/Probation? No data recorded Contacted To Inform of Risk of Harm To Self or Others: No data recorded  Location of Assessment: [facility] ED   Does Patient Present under Involuntary Commitment? No  IVC Papers Initial File Date: No data recorded  [place] of Residence: [place]   Patient Currently Receiving the Following Services: No data recorded  Determination of Need: Emergent (2 hours)   Options For Referral: No data recorded    CCA Biopsychosocial Intake/Chief Complaint:  No data recorded Current Symptoms/Problems: No data recorded  Patient Reported Schizophrenia/Schizoaffective Diagnosis in Past: No   Strengths: Patient is able to communicate  Preferences: No data recorded Abilities: No data recorded  Type of Services Patient Feels are Needed: No data recorded  Initial Clinical Notes/Concerns: No data recorded  Mental Health Symptoms Depression:   Change in energy/activity; Difficulty Concentrating; Irritability   Duration of Depressive symptoms:  Greater than two  weeks   Mania:   None   Anxiety:    Difficulty concentrating; Restlessness   Psychosis:   None   Duration of Psychotic symptoms: No data recorded  Trauma:   None   Obsessions:   None   Compulsions:   None   Inattention:   None   Hyperactivity/Impulsivity:   None   Oppositional/Defiant Behaviors:   None   Emotional Irregularity:   None   Other Mood/Personality Symptoms:  No data recorded   Mental Status Exam Appearance and self-care  Stature:   Average   Weight:   Average weight   Clothing:   Casual   Grooming:   Normal   Cosmetic use:   None   Posture/gait:   Normal   Motor activity:   Not Remarkable   Sensorium  Attention:   Normal   Concentration:   Normal   Orientation:   X5   Recall/memory:   Normal   Affect and Mood  Affect:   Appropriate   Mood:   Other (Comment)   Relating  Eye contact:   Normal   Facial expression:   Responsive   Attitude toward examiner:   Cooperative   Thought and Language  Speech flow:  Clear and Coherent  Thought content:   Appropriate to Mood and Circumstances   Preoccupation:   None   Hallucinations:   None   Organization:  No data recorded  [employer] of Knowledge:   Fair   Intelligence:   Average   Abstraction:   Normal   Judgement:   Fair   Dance movement psychotherapist:   Adequate   Insight:   Good   Decision Making:   Normal   Social Functioning  Social Maturity:   Responsible   Social Judgement:   Normal   Stress  Stressors:   Office manager Ability:   Contractor Deficits:   None   Supports:   Family     Religion: Religion/Spirituality Are You A Religious Person?: No  Leisure/Recreation: Leisure / Recreation Do You Have Hobbies?: No  Exercise/Diet: Exercise/Diet Do You Exercise?: No Have You Gained or Lost A Significant Amount of Weight in the Past Six Months?: No Do You Follow a Special Diet?: No Do You Have  Any Trouble Sleeping?: Yes Explanation of Sleeping Difficulties: "Some nights I sleep okay some nights I don't"   CCA Employment/Education Employment/Work Situation: Employment / Work Situation Employment Situation: Unemployed Has Patient ever Been in Equities trader?: No  Education: Education Is Patient Currently Attending School?: No Did You Have An Individualized Education Program (IIEP): No Did You Have Any Difficulty At [employer]?: No Patient's Education Has Been Impacted by Current Illness: No   CCA Family/Childhood History Family and Relationship History: Family history Marital status: Single Does patient have children?: No  Childhood History:  Childhood History Did patient suffer any verbal/emotional/physical/sexual abuse as a child?: No Did patient suffer from severe childhood neglect?: No Has patient ever been sexually abused/assaulted/raped as an adolescent or adult?: No Was the patient ever a victim of a crime or a disaster?: No Witnessed domestic violence?: No Has patient been affected by domestic violence as an adult?: No  Child/Adolescent Assessment:     CCA Substance Use Alcohol/Drug Use: Alcohol / Drug Use Pain Medications: See MAR Prescriptions: See MAR Over the Counter: See MAR History of alcohol / drug use?: Yes Substance #1 Name of Substance 1: Alcohol 1 - Age of First Use: 18 1 - Amount (size/oz): "I binge drink" 1 - Frequency: "I can go 3 months without drinking then I'll binge for 4 days straight."                       ASAM's:  Six Dimensions of Multidimensional Assessment  Dimension 1:  Acute Intoxication and/or Withdrawal Potential:      Dimension 2:  Biomedical Conditions and Complications:      Dimension 3:  Emotional, Behavioral, or Cognitive Conditions and Complications:     Dimension 4:  Readiness to Change:     Dimension 5:  Relapse, Continued use, or Continued Problem Potential:     Dimension 6:  Recovery/Living  Environment:     ASAM Severity Score:    ASAM Recommended Level of Treatment:     Substance use Disorder (SUD) Substance Use Disorder (SUD)  Checklist Symptoms of Substance Use: Continued use despite having a persistent/recurrent physical/psychological problem caused/exacerbated by use, Continued use despite persistent or recurrent social, interpersonal problems, caused or exacerbated by use, Evidence of tolerance, Large amounts of time spent to obtain, use or recover from the substance(s), Persistent desire or unsuccessful efforts to cut down or control use, Presence of craving or strong urge to use, Recurrent use  that results in a failure to fulfill major role obligations (work, school, home)  Recommendations for Services/Supports/Treatments:    DSM5 Diagnoses: Patient Active Problem List   Diagnosis Date Noted   Schizoaffective disorder, bipolar type (HCC) 07/16/2016   Cannabis use disorder, moderate, dependence (HCC) 07/12/2016   Tobacco use disorder 07/12/2016   Noncompliance 07/11/2016   Alcohol use disorder, moderate, dependence (HCC)     Patient Centered Plan: Patient is on the following Treatment Plan(s):  Depression and Substance Abuse   Referrals to Alternative Service(s): Referred to Alternative Service(s):   Place:   Date:   Time:    Referred to Alternative Service(s):   Place:   Date:   Time:    Referred to Alternative Service(s):   Place:   Date:   Time:    Referred to Alternative Service(s):   Place:   Date:   Time:     Toshi Ishii A Emiya Loomer, LCAS-A

## 2021-10-06 NOTE — ED Notes (Signed)
First encounter with pt to D/C.   D/C and resources discussed with pt, pt verbalized understanding. Pt ambulatory on d/c with steady gait.

## 2021-10-06 NOTE — ED Notes (Signed)
Patient asked this RN if he could be discharged by MD. Reports he no longer wants to wait on psychiatry to give him resources and will go to [facility]. MD Darnelle Catalan made aware and reports he will get discharge papers together at this time

## 2021-12-06 ENCOUNTER — Emergency Department: Payer: Medicaid Other

## 2021-12-06 ENCOUNTER — Other Ambulatory Visit: Payer: Self-pay

## 2021-12-06 ENCOUNTER — Emergency Department
Admission: EM | Admit: 2021-12-06 | Discharge: 2021-12-08 | Disposition: A | Discharge status: Other Acute Inpatient Hospital | Payer: 59 | Attending: Emergency Medicine | Admitting: Emergency Medicine

## 2021-12-06 DIAGNOSIS — F10129 Alcohol abuse with intoxication, unspecified: Secondary | ICD-10-CM | POA: Insufficient documentation

## 2021-12-06 DIAGNOSIS — Z20822 Contact with and (suspected) exposure to covid-19: Secondary | ICD-10-CM | POA: Insufficient documentation

## 2021-12-06 DIAGNOSIS — S022XXA Fracture of nasal bones, initial encounter for closed fracture: Secondary | ICD-10-CM | POA: Insufficient documentation

## 2021-12-06 DIAGNOSIS — F250 Schizoaffective disorder, bipolar type: Secondary | ICD-10-CM | POA: Insufficient documentation

## 2021-12-06 DIAGNOSIS — Z91199 Patient's noncompliance with other medical treatment and regimen due to unspecified reason: Secondary | ICD-10-CM

## 2021-12-06 DIAGNOSIS — F1220 Cannabis dependence, uncomplicated: Secondary | ICD-10-CM | POA: Diagnosis present

## 2021-12-06 DIAGNOSIS — Z79899 Other long term (current) drug therapy: Secondary | ICD-10-CM | POA: Insufficient documentation

## 2021-12-06 DIAGNOSIS — F17210 Nicotine dependence, cigarettes, uncomplicated: Secondary | ICD-10-CM | POA: Insufficient documentation

## 2021-12-06 DIAGNOSIS — F1020 Alcohol dependence, uncomplicated: Secondary | ICD-10-CM | POA: Diagnosis present

## 2021-12-06 DIAGNOSIS — F17200 Nicotine dependence, unspecified, uncomplicated: Secondary | ICD-10-CM | POA: Diagnosis present

## 2021-12-06 DIAGNOSIS — S0003XA Contusion of scalp, initial encounter: Secondary | ICD-10-CM

## 2021-12-06 DIAGNOSIS — F10920 Alcohol use, unspecified with intoxication, uncomplicated: Secondary | ICD-10-CM

## 2021-12-06 NOTE — ED Notes (Signed)
Pt. Transferred from Triage to room 19 H after dressing out and screening for contraband. Report to include Situation, Background, Assessment and Recommendations from triage RN. Pt. Oriented to Quad including Q15 minute rounds as well as Psychologist, counselling for their protection. Patient is alert and oriented, warm and dry in no acute distress. Patient denies SI, HI, and AVH. Patient is intoxicated and restless. He has a little cut with blood on his head. He is order for CT scan. Pt. Encouraged to let me know if needs arise.

## 2021-12-06 NOTE — ED Triage Notes (Signed)
Pt brought in by friend who stated pt "needs help'  Pt denies any SI or HI and does not want to stay.  Pt was asked specifically if he wished to be evaluated by the physician or psychiatrist and pt declined.  Pt does appear to be intoxicated and endorses drinking two "40s" before coming in as well as  "smoking a joint"  Pt denies any pain.

## 2021-12-07 DIAGNOSIS — F250 Schizoaffective disorder, bipolar type: Secondary | ICD-10-CM | POA: Diagnosis not present

## 2021-12-07 DIAGNOSIS — F1020 Alcohol dependence, uncomplicated: Secondary | ICD-10-CM | POA: Diagnosis not present

## 2021-12-07 LAB — CBC WITH DIFFERENTIAL/PLATELET
Abs Immature Granulocytes: 0.01 10*3/uL (ref 0.00–0.07)
Basophils Absolute: 0 10*3/uL (ref 0.0–0.1)
Basophils Relative: 0 %
Eosinophils Absolute: 0.1 10*3/uL (ref 0.0–0.5)
Eosinophils Relative: 2 %
HCT: 47.7 % (ref 39.0–52.0)
Hemoglobin: 16 g/dL (ref 13.0–17.0)
Immature Granulocytes: 0 %
Lymphocytes Relative: 31 %
Lymphs Abs: 2.1 10*3/uL (ref 0.7–4.0)
MCH: 29.5 pg (ref 26.0–34.0)
MCHC: 33.5 g/dL (ref 30.0–36.0)
MCV: 87.8 fL (ref 80.0–100.0)
Monocytes Absolute: 0.5 10*3/uL (ref 0.1–1.0)
Monocytes Relative: 7 %
Neutro Abs: 4 10*3/uL (ref 1.7–7.7)
Neutrophils Relative %: 60 %
Platelets: 210 10*3/uL (ref 150–400)
RBC: 5.43 MIL/uL (ref 4.22–5.81)
RDW: 13.1 % (ref 11.5–15.5)
WBC: 6.8 10*3/uL (ref 4.0–10.5)
nRBC: 0 % (ref 0.0–0.2)

## 2021-12-07 LAB — COMPREHENSIVE METABOLIC PANEL
ALT: 97 U/L — ABNORMAL HIGH (ref 0–44)
AST: 107 U/L — ABNORMAL HIGH (ref 15–41)
Albumin: 5.1 g/dL — ABNORMAL HIGH (ref 3.5–5.0)
Alkaline Phosphatase: 83 U/L (ref 38–126)
Anion gap: 8 (ref 5–15)
BUN: 6 mg/dL (ref 6–20)
CO2: 26 mmol/L (ref 22–32)
Calcium: 9 mg/dL (ref 8.9–10.3)
Chloride: 105 mmol/L (ref 98–111)
Creatinine, Ser: 0.82 mg/dL (ref 0.61–1.24)
GFR, Estimated: >60 mL/min (ref >60)
Glucose, Bld: 94 mg/dL (ref 70–99)
Potassium: 3.7 mmol/L (ref 3.5–5.1)
Sodium: 139 mmol/L (ref 135–145)
Total Bilirubin: 0.7 mg/dL (ref 0.3–1.2)
Total Protein: 8.5 g/dL — ABNORMAL HIGH (ref 6.5–8.1)

## 2021-12-07 LAB — URINE DRUG SCREEN, QUALITATIVE (ARMC ONLY)
Amphetamines, Ur Screen: NOT DETECTED
Barbiturates, Ur Screen: NOT DETECTED
Benzodiazepine, Ur Scrn: NOT DETECTED
Cannabinoid 50 Ng, Ur ~~LOC~~: POSITIVE — AB
Cocaine Metabolite,Ur ~~LOC~~: POSITIVE — AB
MDMA (Ecstasy)Ur Screen: NOT DETECTED
Methadone Scn, Ur: NOT DETECTED
Opiate, Ur Screen: NOT DETECTED
Phencyclidine (PCP) Ur S: NOT DETECTED
Tricyclic, Ur Screen: NOT DETECTED

## 2021-12-07 LAB — RESP PANEL BY RT-PCR (FLU A&B, COVID) ARPGX2
Influenza A by PCR: NEGATIVE
Influenza B by PCR: NEGATIVE
SARS Coronavirus 2 by RT PCR: NEGATIVE

## 2021-12-07 LAB — ACETAMINOPHEN LEVEL: Acetaminophen (Tylenol), Serum: <10 ug/mL — ABNORMAL LOW (ref 10–30)

## 2021-12-07 LAB — LIPASE, BLOOD: Lipase: 34 U/L (ref 11–51)

## 2021-12-07 LAB — SALICYLATE LEVEL: Salicylate Lvl: <7 mg/dL — ABNORMAL LOW (ref 7.0–30.0)

## 2021-12-07 MED ORDER — TETANUS-DIPHTH-ACELL PERTUSSIS 5-2.5-18.5 LF-MCG/0.5 IM SUSY
0.5000 mL | PREFILLED_SYRINGE | Freq: Once | INTRAMUSCULAR | Status: DC
  Filled 2021-12-07: qty 0.5

## 2021-12-07 MED ORDER — BENZTROPINE MESYLATE 1 MG PO TABS
1.0000 mg | ORAL_TABLET | Freq: Every day | ORAL | Status: DC
  Administered 2021-12-07 – 2021-12-08 (×2): 1 mg via ORAL
  Filled 2021-12-07 (×2): qty 1

## 2021-12-07 MED ORDER — HALOPERIDOL 5 MG PO TABS
5.0000 mg | ORAL_TABLET | Freq: Two times a day (BID) | ORAL | Status: DC
  Administered 2021-12-07 – 2021-12-08 (×2): 5 mg via ORAL
  Filled 2021-12-07 (×2): qty 1

## 2021-12-07 MED ORDER — NICOTINE 21 MG/24HR TD PT24
21.0000 mg | MEDICATED_PATCH | Freq: Once | TRANSDERMAL | Status: AC
  Administered 2021-12-07: 02:00:00 21 mg via TRANSDERMAL
  Filled 2021-12-07: qty 1

## 2021-12-07 MED ORDER — LORAZEPAM 2 MG PO TABS
2.0000 mg | ORAL_TABLET | Freq: Once | ORAL | Status: AC
  Administered 2021-12-07: 02:00:00 2 mg via ORAL
  Filled 2021-12-07: qty 1

## 2021-12-07 MED ORDER — HALOPERIDOL 5 MG PO TABS
10.0000 mg | ORAL_TABLET | Freq: Two times a day (BID) | ORAL | Status: DC
  Administered 2021-12-07: 02:00:00 10 mg via ORAL
  Filled 2021-12-07: qty 2

## 2021-12-07 MED ORDER — DIPHENHYDRAMINE HCL 25 MG PO CAPS
50.0000 mg | ORAL_CAPSULE | Freq: Once | ORAL | Status: AC
  Administered 2021-12-07: 02:00:00 50 mg via ORAL
  Filled 2021-12-07: qty 2

## 2021-12-07 MED ORDER — NICOTINE 21 MG/24HR TD PT24
21.0000 mg | MEDICATED_PATCH | Freq: Every day | TRANSDERMAL | Status: DC
  Administered 2021-12-07 – 2021-12-08 (×2): 21 mg via TRANSDERMAL
  Filled 2021-12-07 (×2): qty 1

## 2021-12-07 NOTE — ED Notes (Signed)
Called [employer] for officer to come by and fill out IVC paperwork

## 2021-12-07 NOTE — BH Assessment (Signed)
Comprehensive Clinical Assessment (CCA) Note  12/07/2021 Joshua [name] [id number] Recommendations for Services/Supports/Treatments: Consulted with Madaline Brilliant., NP, who recommended pt. be observed overnight and reassessed in the morning. Notified Dr. Scotty Court and Lacinda Axon, RN of disposition recommendation.   Joshua [name] is a 30 year old, English speaking, Black male with a hx of schizoaffective disorder. Pt also has a history of cannabis use disorder and alcohol use disorder. Pt presented to [facility] ED under IVC due to concerns of pt.'s family and friends about his current mental status. On assessment, pt. presented as visibly under the influence of alcohol, staggering, and exhibiting restless behavior. Pt was surrounded by security staff and was adamant about not needing help and wanting to discharge. Pt agreed to participate in the assessment and had a respectful attitude toward examiners. Pt had flashes of insight, as he admitted to being an alcoholic and eluded to going downstairs to the psych unit; however, in the next sentence he'd be adamant that he did not need help. Pt reported that he'd previously tried the [employer] program through [facility], however it was ineffective. Pt verbalized that people keep doing him dirty in the context of wanting to exploit him financially. Pt expressed, my mother could give 2 fucks about me. Pt was religiously preoccupied as he became expansive about religious beliefs, stating The Devil gets to moving because he hates it when God is moving. Pt admitted that he'd lost 2 jobs about 2 weeks ago due to his excessive alcohol consumption. Pt remained calm and cooperative throughout the assessment. Pt had slurred speech and impaired psychomotor activity. Pt admitted to drinking two 40 oz's prior to arrival. Pt's UDS was + for cocaine and cannabis. Pt's BAL was 269. Pt was oriented x4. Pt's mood was labile and affect was responsive. Pt's grooming was neglected. Pt denied current  SI/HI/AV/H. Pt did admit to daily mood lability.    Chief Complaint:  Chief Complaint  Patient presents with   Alcohol Intoxication   Visit Diagnosis: Alcohol use disorder, moderate, dependence (HCC)   Noncompliance   Cannabis use disorder, moderate, dependence (HCC)   Tobacco use disorder   Schizoaffective disorder, bipolar type (HCC)   CCA Screening, Triage and Referral (STR)  Patient Reported Information How did you hear about [place]? Family/Friend  Referral name: No data recorded Referral phone number: No data recorded  Whom do you see for routine medical problems? No data recorded Practice/Facility Name: No data recorded Practice/Facility Phone Number: No data recorded Name of Contact: No data recorded Contact Number: No data recorded Contact Fax Number: No data recorded Prescriber Name: No data recorded Prescriber Address (if known): No data recorded  What Is the Reason for Your Visit/Call Today? Pt presented involuntarily due to pt's family and friends concerns about the pt's mental status.  How Long Has This Been Causing You Problems? > than 6 months  What Do You Feel Would Help You the Most Today? Alcohol or Drug Use Treatment   Have You Recently Been in Any Inpatient Treatment (Hospital/Detox/Crisis Center/28-Day Program)? No data recorded Name/Location of Program/Hospital:No data recorded How Long Were You There? No data recorded When Were You Discharged? No data recorded  Have You Ever Received Services From [facility] Before? No data recorded Who Do You See at [facility]? No data recorded  Have You Recently Had Any Thoughts About Hurting Yourself? No  Are You Planning to Commit Suicide/Harm Yourself At This time? No   Have you Recently Had Thoughts About Hurting Someone Karolee Ohs? No  Explanation: No data recorded  Have You Used Any Alcohol or Drugs in the Past 24 Hours? Yes  How Long Ago Did You Use Drugs or Alcohol? No data recorded What Did You Use  and How Much? Alcohol; pt reported drinking "2 40s"   Do You Currently Have a Therapist/Psychiatrist? No  Name of Therapist/Psychiatrist: No data recorded  Have You Been Recently Discharged From Any Office Practice or Programs? No  Explanation of Discharge From Practice/Program: No data recorded    CCA Screening Triage Referral Assessment Type of Contact: Face-to-Face  Is this Initial or Reassessment? No data recorded Date Telepsych consult ordered in [facility]:  No data recorded Time Telepsych consult ordered in [facility]:  No data recorded  Patient Reported Information Reviewed? No data recorded Patient Left Without Being Seen? No data recorded Reason for Not Completing Assessment: No data recorded  Collateral Involvement: None   Does Patient Have a Court Appointed Legal Guardian? No data recorded Name and Contact of Legal Guardian: No data recorded If Minor and Not Living with Parent(s), Who has Custody? No data recorded Is CPS involved or ever been involved? Never  Is APS involved or ever been involved? Never   Patient Determined To Be At Risk for Harm To Self or Others Based on Review of Patient Reported Information or Presenting Complaint? No  Method: No data recorded Availability of Means: No data recorded Intent: No data recorded Notification Required: No data recorded Additional Information for Danger to Others Potential: No data recorded Additional Comments for Danger to Others Potential: No data recorded Are There Guns or Other Weapons in Your Home? No data recorded Types of Guns/Weapons: No data recorded Are These Weapons Safely Secured?                            No data recorded Who Could Verify You Are Able To Have These Secured: No data recorded Do You Have any Outstanding Charges, Pending Court Dates, Parole/Probation? No data recorded Contacted To Inform of Risk of Harm To Self or Others: No data recorded  Location of Assessment: [facility] ED   Does Patient  Present under Involuntary Commitment? Yes  IVC Papers Initial File Date: 12/07/21   [place] of Residence: [place]   Patient Currently Receiving the Following Services: Not Receiving Services   Determination of Need: Emergent (2 hours)   Options For Referral: No data recorded    CCA Biopsychosocial Intake/Chief Complaint:  No data recorded Current Symptoms/Problems: No data recorded  Patient Reported Schizophrenia/Schizoaffective Diagnosis in Past: Yes   Strengths: Patient is able to communicate  Preferences: No data recorded Abilities: No data recorded  Type of Services Patient Feels are Needed: No data recorded  Initial Clinical Notes/Concerns: No data recorded  Mental Health Symptoms Depression:   Irritability   Duration of Depressive symptoms:  Greater than two weeks   Mania:   None   Anxiety:    Restlessness   Psychosis:   None   Duration of Psychotic symptoms: No data recorded  Trauma:   N/A   Obsessions:   N/A   Compulsions:   N/A   Inattention:   None   Hyperactivity/Impulsivity:   None   Oppositional/Defiant Behaviors:   None   Emotional Irregularity:   Mood lability   Other Mood/Personality Symptoms:   n/a    Mental Status Exam Appearance and self-care  Stature:   Average   Weight:   Average weight   Clothing:   -- (  In scrubs)   Grooming:   Neglected   Cosmetic use:   None   Posture/gait:   Normal   Motor activity:   Restless   Sensorium  Attention:   Normal   Concentration:   Normal   Orientation:   Situation; Place; Person; Object   Recall/memory:   Normal   Affect and Mood  Affect:   Appropriate   Mood:   Irritable   Relating  Eye contact:   Fleeting   Facial expression:   Responsive   Attitude toward examiner:   Cooperative   Thought and Language  Speech flow:  Slurred   Thought content:   Appropriate to Mood and Circumstances   Preoccupation:   None    Hallucinations:   None   Organization:  No data recorded  [employer] of Knowledge:   Fair   Intelligence:   Average   Abstraction:   Functional   Judgement:   Impaired   Reality Testing:   Adequate   Insight:   Flashes of insight   Decision Making:   Impulsive   Social Functioning  Social Maturity:   Self-centered   Social Judgement:   Victimized   Stress  Stressors:   Surveyor, quantity; Grief/losses; Work   Coping Ability:   Exhausted   Skill Deficits:   Scientist, physiological; Interpersonal   Supports:   Family; Support needed     Religion: Religion/Spirituality Are You A Religious Person?: No  Leisure/Recreation: Leisure / Recreation Do You Have Hobbies?: No  Exercise/Diet: Exercise/Diet Do You Exercise?: No Have You Gained or Lost A Significant Amount of Weight in the Past Six Months?: No Do You Follow a Special Diet?: No Do You Have Any Trouble Sleeping?: Yes Explanation of Sleeping Difficulties: "Some nights I sleep okay some nights I don't"   CCA Employment/Education Employment/Work Situation: Employment / Work Situation Employment Situation: Unemployed Patient's Job has Been Impacted by Current Illness: Yes Describe how Patient's Job has Been Impacted: Pt's excessive drinking has made it difficult for him to maintain employment; pt has recently lost both of his jobs. Has Patient ever Been in the [employer]?: No  Education: Education Is Patient Currently Attending School?: No Did You Attend College?: No Did You Have An Individualized Education Program (IIEP): No Did You Have Any Difficulty At School?: No Patient's Education Has Been Impacted by Current Illness: No   CCA Family/Childhood History Family and Relationship History: Family history Marital status: Single Does patient have children?: No  Childhood History:  Childhood History By whom was/is the patient raised?: Mother/father and step-parent Did patient suffer  any verbal/emotional/physical/sexual abuse as a child?: No Did patient suffer from severe childhood neglect?: No Has patient ever been sexually abused/assaulted/raped as an adolescent or adult?: No Was the patient ever a victim of a crime or a disaster?: No Witnessed domestic violence?: No Has patient been affected by domestic violence as an adult?: No  Child/Adolescent Assessment:     CCA Substance Use Alcohol/Drug Use: Alcohol / Drug Use Pain Medications: See MAR Prescriptions: See MAR Over the Counter: See MAR History of alcohol / drug use?: Yes Longest period of sobriety (when/how long): 2 1/2 months about 3 months ago Negative Consequences of Use: Legal, Personal relationships, Financial, Work / School Withdrawal Symptoms: None (Denies)                         ASAM's:  Six Dimensions of Multidimensional Assessment  Dimension 1:  Acute  Intoxication and/or Withdrawal Potential:   Dimension 1:  Description of individual's past and current experiences of substance use and withdrawal: Pt has a hx of cannabis use disorder and alcohol use disorder  Dimension 2:  Biomedical Conditions and Complications:      Dimension 3:  Emotional, Behavioral, or Cognitive Conditions and Complications:  Dimension 3:  Description of emotional, behavioral, or cognitive conditions and complications: Pt has a hx of mental illness (schizoaffective disorder)  Dimension 4:  Readiness to Change:     Dimension 5:  Relapse, Continued use, or Continued Problem Potential:     Dimension 6:  Recovery/Living Environment:     ASAM Severity Score: ASAM's Severity Rating Score: 17  ASAM Recommended Level of Treatment:     Substance use Disorder (SUD) Substance Use Disorder (SUD)  Checklist Symptoms of Substance Use: Continued use despite having a persistent/recurrent physical/psychological problem caused/exacerbated by use, Continued use despite persistent or recurrent social, interpersonal problems,  caused or exacerbated by use, Evidence of tolerance, Large amounts of time spent to obtain, use or recover from the substance(s), Persistent desire or unsuccessful efforts to cut down or control use, Presence of craving or strong urge to use, Recurrent use that results in a failure to fulfill major role obligations (work, school, home)  Recommendations for Services/Supports/Treatments: Recommendations for Services/Supports/Treatments Recommendations For Services/Supports/Treatments: Inpatient Hospitalization  DSM5 Diagnoses: Patient Active Problem List   Diagnosis Date Noted   Schizoaffective disorder, bipolar type ([place]) 07/16/2016   Cannabis use disorder, moderate, dependence ([place]) 07/12/2016   Tobacco use disorder 07/12/2016   Noncompliance 07/11/2016   Alcohol use disorder, moderate, dependence ([place])     Donnesha Karg R Clydell Alberts, LCAS

## 2021-12-07 NOTE — ED Notes (Signed)
Pt moved to [facility]
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
## 2021-12-07 NOTE — ED Notes (Addendum)
Registration let family member back to visit pt without making this RN aware. Family member allowed to stay at this time for visit and wanded by security. Family member updated on POC.

## 2021-12-07 NOTE — ED Notes (Signed)
Patient is restless and wanting to leave the ED. He continues asking for the discharge paper. EDP notified.

## 2021-12-07 NOTE — ED Notes (Signed)
Pt given nighttime snack. 

## 2021-12-07 NOTE — ED Notes (Signed)
Patient's mother called and stated that she is worried about her son. She wants the patient  to be involuntary committed. She mentioned she lives in [place] and doesn't know what to do. She spoke with EDP and patient is going to be IVC'd.
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
## 2021-12-07 NOTE — ED Notes (Signed)
Nursing and security staff redirecting the patient to stay in his bed. When asked if he takes oral medication to calm him down, he agreed.He requested for nicotine patch. EDP notified.

## 2021-12-07 NOTE — ED Notes (Signed)
Pts breakfast placed at bedside; pt continues sleeping at this time.

## 2021-12-07 NOTE — ED Notes (Signed)
IVC pending placement or inpatient

## 2021-12-07 NOTE — ED Notes (Signed)
Pt. Transferred to BHU from ED to room 2 after screening for contraband. Report to include Situation, Background, Assessment and Recommendations from [facility]. Pt. Oriented to unit including Q15 minute rounds as well as the security cameras for their protection. Patient is alert and oriented, warm and dry in no acute distress. Patient denies SI, HI, and AVH. Pt. Encouraged to let me know if needs arise.
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
## 2021-12-08 ENCOUNTER — Other Ambulatory Visit: Payer: Self-pay

## 2021-12-08 ENCOUNTER — Encounter: Payer: Self-pay | Admitting: Psychiatry

## 2021-12-08 ENCOUNTER — Inpatient Hospital Stay
Admission: AD | Admit: 2021-12-08 | Discharge: 2021-12-11 | DRG: 885 | Disposition: A | Discharge status: Home / Self Care | Payer: 59 | Attending: Psychiatry | Admitting: Psychiatry

## 2021-12-08 DIAGNOSIS — Z560 Unemployment, unspecified: Secondary | ICD-10-CM | POA: Diagnosis not present

## 2021-12-08 DIAGNOSIS — R45851 Suicidal ideations: Secondary | ICD-10-CM | POA: Diagnosis present

## 2021-12-08 DIAGNOSIS — Z79899 Other long term (current) drug therapy: Secondary | ICD-10-CM

## 2021-12-08 DIAGNOSIS — F1490 Cocaine use, unspecified, uncomplicated: Secondary | ICD-10-CM | POA: Diagnosis present

## 2021-12-08 DIAGNOSIS — G4700 Insomnia, unspecified: Secondary | ICD-10-CM | POA: Diagnosis present

## 2021-12-08 DIAGNOSIS — Z5900 Homelessness unspecified: Secondary | ICD-10-CM | POA: Diagnosis not present

## 2021-12-08 DIAGNOSIS — F17210 Nicotine dependence, cigarettes, uncomplicated: Secondary | ICD-10-CM | POA: Diagnosis present

## 2021-12-08 DIAGNOSIS — F250 Schizoaffective disorder, bipolar type: Secondary | ICD-10-CM

## 2021-12-08 DIAGNOSIS — F10129 Alcohol abuse with intoxication, unspecified: Secondary | ICD-10-CM | POA: Diagnosis present

## 2021-12-08 DIAGNOSIS — F1290 Cannabis use, unspecified, uncomplicated: Secondary | ICD-10-CM | POA: Diagnosis present

## 2021-12-08 MED ORDER — ADULT MULTIVITAMIN W/MINERALS CH
1.0000 | ORAL_TABLET | Freq: Every day | ORAL | Status: DC
  Administered 2021-12-08: 10:00:00 1 via ORAL
  Filled 2021-12-08: qty 1

## 2021-12-08 MED ORDER — MIRTAZAPINE 15 MG PO TABS
7.5000 mg | ORAL_TABLET | Freq: Every day | ORAL | Status: DC
  Administered 2021-12-08: 21:00:00 7.5 mg via ORAL
  Filled 2021-12-08: qty 1

## 2021-12-08 MED ORDER — THIAMINE HCL 100 MG/ML IJ SOLN: 100.0000 mg | Freq: Once | INTRAMUSCULAR | Status: DC

## 2021-12-08 MED ORDER — LORAZEPAM 1 MG PO TABS: 1.0000 mg | ORAL_TABLET | Freq: Every day | ORAL | Status: DC

## 2021-12-08 MED ORDER — MAGNESIUM HYDROXIDE 400 MG/5ML PO SUSP: 30.0000 mL | Freq: Every day | ORAL | Status: DC | PRN

## 2021-12-08 MED ORDER — ONDANSETRON 4 MG PO TBDP
4.0000 mg | ORAL_TABLET | Freq: Four times a day (QID) | ORAL | Status: DC | PRN
  Filled 2021-12-08: qty 1

## 2021-12-08 MED ORDER — LORAZEPAM 1 MG PO TABS
1.0000 mg | ORAL_TABLET | Freq: Four times a day (QID) | ORAL | Status: DC
  Administered 2021-12-08: 10:00:00 1 mg via ORAL
  Filled 2021-12-08: qty 1

## 2021-12-08 MED ORDER — LORAZEPAM 1 MG PO TABS: 1.0000 mg | ORAL_TABLET | Freq: Three times a day (TID) | ORAL | Status: DC

## 2021-12-08 MED ORDER — ACETAMINOPHEN 325 MG PO TABS: 650.0000 mg | ORAL_TABLET | Freq: Four times a day (QID) | ORAL | Status: DC | PRN

## 2021-12-08 MED ORDER — LOPERAMIDE HCL 2 MG PO CAPS
2.0000 mg | ORAL_CAPSULE | ORAL | Status: DC | PRN
  Filled 2021-12-08: qty 2

## 2021-12-08 MED ORDER — ALUM & MAG HYDROXIDE-SIMETH 200-200-20 MG/5ML PO SUSP: 30.0000 mL | ORAL | Status: DC | PRN

## 2021-12-08 MED ORDER — LORAZEPAM 1 MG PO TABS: 1.0000 mg | ORAL_TABLET | Freq: Two times a day (BID) | ORAL | Status: DC

## 2021-12-08 MED ORDER — LORAZEPAM 1 MG PO TABS: 1.0000 mg | ORAL_TABLET | Freq: Four times a day (QID) | ORAL | Status: DC | PRN

## 2021-12-08 MED ORDER — NICOTINE 21 MG/24HR TD PT24
21.0000 mg | MEDICATED_PATCH | Freq: Every day | TRANSDERMAL | Status: DC
  Administered 2021-12-08 – 2021-12-11 (×4): 21 mg via TRANSDERMAL
  Filled 2021-12-08 (×4): qty 1

## 2021-12-08 MED ORDER — HYDROXYZINE HCL 25 MG PO TABS: 25.0000 mg | ORAL_TABLET | Freq: Four times a day (QID) | ORAL | Status: DC | PRN

## 2021-12-08 MED ORDER — BENZTROPINE MESYLATE 1 MG PO TABS
1.0000 mg | ORAL_TABLET | Freq: Every day | ORAL | Status: DC
  Administered 2021-12-08 – 2021-12-11 (×4): 1 mg via ORAL
  Filled 2021-12-08 (×4): qty 1

## 2021-12-08 MED ORDER — THIAMINE HCL 100 MG PO TABS: 100.0000 mg | ORAL_TABLET | Freq: Every day | ORAL | Status: DC

## 2021-12-08 MED ORDER — HALOPERIDOL 5 MG PO TABS
5.0000 mg | ORAL_TABLET | Freq: Two times a day (BID) | ORAL | Status: DC
  Administered 2021-12-08 – 2021-12-11 (×6): 5 mg via ORAL
  Filled 2021-12-08 (×6): qty 1

## 2021-12-08 NOTE — ED Provider Notes (Signed)
Emergency Medicine Observation Re-evaluation Note  Joshua [name] is a 30 y.o. male, seen on rounds today.  Pt initially presented to the ED for complaints of No chief complaint on file. Currently, the patient is resting comfortably and has no acute complaints.  Physical Exam  BP 129/84 (BP Location: Left Arm)    Pulse 84    Temp 98.3 F (36.8 C) (Oral)    Ht 5\' 11"  (1.803 m)    Wt 59 kg    SpO2 100%    BMI 18.13 kg/m  Physical Exam General: No distress Cardiac: Well perfused Lungs: Normal effort Psych: Calm and cooperative  ED Course / MDM   I have reviewed the labs performed to date as well as medications administered while in observation.  There have been no changes to his status in the last 24 hours.  Plan  Current plan is for inpatient psychiatric admission. Joshua [name] is under involuntary commitment.      [name], MD 12/08/21 1227

## 2021-12-08 NOTE — Tx Team (Signed)
Initial Treatment Plan 12/08/2021 2:53 PM Joshua [name] [id number]    PATIENT STRESSORS: Substance abuse     PATIENT STRENGTHS: Ability for insight    PATIENT IDENTIFIED PROBLEMS: Substance abuse                     DISCHARGE CRITERIA:  Motivation to continue treatment in a less acute level of care  PRELIMINARY DISCHARGE PLAN: Return to previous living arrangement  PATIENT/FAMILY INVOLVEMENT: This treatment plan has been presented to and reviewed with the patient, Joshua [name].  The patient and family have been given the opportunity to ask questions and make suggestions.  Hyman Hopes, RN 12/08/2021, 2:53 PM

## 2021-12-08 NOTE — ED Notes (Signed)
Pt transferring to BMU RM 319. Admission discussed with pt and he is agreeable to POC. Transported by Shanda Bumps, NT and security to BMU with all his personal belongings. Report given to [facility], Charity fundraiser.

## 2021-12-08 NOTE — Progress Notes (Signed)
Patient ID: Joshua [name], male   DOB: 23-Feb-1991, 30 y.o.   MRN: [id number] Admission note: Patient is a 30 year old male, presents IVC per report of schizoaffective disorder, bipolar type. Patient is alert and oriented to unit. Patients affect is appropriate to circumstance. Patient is calm and cooperative during assessment questions. Patient denies pain at time of assessment.  Patient denies stressors related to admission at this time. Patient states that he doesn't remember coming to the emergency department and just knows that friends brought him in. Patient currently denies SI/HI/AVH. Unit policies explained and verbalized understanding. Q15 minute checks maintained and will continue to monitor.
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
## 2021-12-08 NOTE — H&P (Addendum)
Psychiatric Admission Assessment Adult  Patient Identification: Joshua [name] MRN:  [id number] Date of Evaluation:  12/08/2021 Chief Complaint:  Schizoaffective disorder, bipolar type ([place]) [F25.0] Principal Diagnosis: Schizoaffective disorder, bipolar type ([place]) Diagnosis:  Principal Problem:   Schizoaffective disorder, bipolar type ([place])   Patient is a 30yo man with psychiatric history of Schizoaffective, who was admitted to [facility] unit due to suicidal ideations in settings of being off psych medications.  History of Present Illness:  Per chart, patient was initially brought to the ED due to alcohol intoxication. His urine was also found to be positive for cocaine and cannabis. Reportedly, his mother called to also report that he has been voicing suicidal ideation for the past few days.  He has not been taking any medications, he recently lost his job, is currently homeless, and she is very fearful for his safety. Per psych consult note, the patient was presenting with some delusional thinking. Patient reportedly agreed to to admission to the BMU at [facility] for stabilization, although his mother already IVC'd him as she had concerns that he was suicidal.    Patient seen. Patient reports he is "doing better". He says he was agreeable for this admission and thinks he needs to be on psych medications. He reports feeling depressed and anxious; stressors - issues with employment. He complains about insomnia. He denies feeling suicidal or homicidal. He denies auditory or visual hallucinations. He admits to using cannabis and cocaine on occasion. He admits to drinking, states he does not drink much. Denies any current symptoms of alcohol, withdrawal. He agreed to continue Haldol, started in the ER. States it was helpful for his mood in the past. Denies side effects from the medications.  Past Psychiatric History: Schizoaffective disorder, alcohol use disorder. Several past psych admission. Past psych  medications: Haldol, Abilify.    Total Time spent with patient: 45 minutes   Is the patient at risk to self? Yes.    Has the patient been a risk to self in the past 6 months? No.  Has the patient been a risk to self within the distant past? No.  Is the patient a risk to others? No.  Has the patient been a risk to others in the past 6 months? No.  Has the patient been a risk to others within the distant past? No.   Prior Inpatient Therapy:   Prior Outpatient Therapy:    Alcohol Screening: Patient refused Alcohol Screening Tool: Yes 1. How often do you have a drink containing alcohol?: 4 or more times a week 2. How many drinks containing alcohol do you have on a typical day when you are drinking?: 7, 8, or 9 3. How often do you have six or more drinks on one occasion?: Daily or almost daily AUDIT-C Score: 11 Substance Abuse History in the last 12 months:  Yes.   Consequences of Substance Abuse: NA Previous Psychotropic Medications: Yes  Psychological Evaluations: Yes  Past Medical History:  Past Medical History:  Diagnosis Date   Bipolar 1 disorder ([place])    Schizophrenia ([place])    History reviewed. No pertinent surgical history. Family History: History reviewed. No pertinent family history. Family Psychiatric  History: unremarkable Tobacco Screening:   Social History:  Social History   Substance and Sexual Activity  Alcohol Use Not Currently   Alcohol/week: 20.0 standard drinks   Types: 20 Cans of beer per week   Comment: 12/22/16- states he drank "a lot last night"     Social History  Substance and Sexual Activity  Drug Use Yes   Types: Marijuana   Comment: crack, cocaine    Additional Social History:  He dropped out of school in the 11th grade. 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 He lives with his mother and sisters.      Allergies:  No Known Allergies Lab Results:  Results for orders placed or performed during the hospital  encounter of 12/06/21 (from the past 48 hour(s))  Comprehensive metabolic panel     Status: Abnormal   Collection Time: 12/06/21 11:44 PM  Result Value Ref Range   Sodium 139 135 - 145 mmol/L   Potassium 3.7 3.5 - 5.1 mmol/L   Chloride 105 98 - 111 mmol/L   CO2 26 22 - 32 mmol/L   Glucose, Bld 94 70 - 99 mg/dL    Comment: Glucose reference range applies only to samples taken after fasting for at least 8 hours.   BUN 6 6 - 20 mg/dL   Creatinine, Ser 0.82 0.61 - 1.24 mg/dL   Calcium 9.0 8.9 - 10.3 mg/dL   Total Protein 8.5 (H) 6.5 - 8.1 g/dL   Albumin 5.1 (H) 3.5 - 5.0 g/dL   AST 107 (H) 15 - 41 U/L   ALT 97 (H) 0 - 44 U/L   Alkaline Phosphatase 83 38 - 126 U/L   Total Bilirubin 0.7 0.3 - 1.2 mg/dL   GFR, Estimated >60 >60 mL/min    Comment: (NOTE) Calculated using the CKD-EPI Creatinine Equation (2021)    Anion gap 8 5 - 15    Comment: Performed at [facility], [address] [place], [place] [address]  Lipase, blood     Status: None   Collection Time: 12/06/21 11:44 PM  Result Value Ref Range   Lipase 34 11 - 51 U/L    Comment: Performed at [facility], [address] [place], [place] [address]  CBC with Differential     Status: None   Collection Time: 12/06/21 11:44 PM  Result Value Ref Range   WBC 6.8 4.0 - 10.5 K/uL   RBC 5.43 4.22 - 5.81 MIL/uL   Hemoglobin 16.0 13.0 - 17.0 g/dL   HCT 47.7 39.0 - 52.0 %   MCV 87.8 80.0 - 100.0 fL   MCH 29.5 26.0 - 34.0 pg   MCHC 33.5 30.0 - 36.0 g/dL   RDW 13.1 11.5 - 15.5 %   Platelets 210 150 - 400 K/uL   nRBC 0.0 0.0 - 0.2 %   Neutrophils Relative % 60 %   Neutro Abs 4.0 1.7 - 7.7 K/uL   Lymphocytes Relative 31 %   Lymphs Abs 2.1 0.7 - 4.0 K/uL   Monocytes Relative 7 %   Monocytes Absolute 0.5 0.1 - 1.0 K/uL   Eosinophils Relative 2 %   Eosinophils Absolute 0.1 0.0 - 0.5 K/uL   Basophils Relative 0 %   Basophils Absolute 0.0 0.0 - 0.1 K/uL   Immature Granulocytes 0 %   Abs Immature Granulocytes 0.01  0.00 - 0.07 K/uL    Comment: Performed at [facility], [address] [place], [place] [address]  Acetaminophen level     Status: Abnormal   Collection Time: 12/06/21 11:44 PM  Result Value Ref Range   Acetaminophen (Tylenol), Serum <10 (L) 10 - 30 ug/mL    Comment: (NOTE) Therapeutic concentrations vary significantly. A range of 10-30 ug/mL  may be an effective concentration for many patients.  However, some  are best treated at concentrations outside of this range. Acetaminophen concentrations >150 ug/mL at 4 hours after ingestion  and >50 ug/mL at 12 hours after ingestion are often associated with  toxic reactions.  Performed at [facility], [address] [place], [place] [address]   Salicylate level     Status: Abnormal   Collection Time: 12/06/21 11:44 PM  Result Value Ref Range   Salicylate Lvl <7.0 (L) 7.0 - 30.0 mg/dL    Comment: Performed at [facility], [address] [place], [place] [address]  Urine Drug Screen, Qualitative     Status: Abnormal   Collection Time: 12/06/21 11:52 PM  Result Value Ref Range   Tricyclic, Ur Screen NONE DETECTED NONE DETECTED   Amphetamines, Ur Screen NONE DETECTED NONE DETECTED   MDMA (Ecstasy)Ur Screen NONE DETECTED NONE DETECTED   Cocaine Metabolite,Ur [place] POSITIVE (A) NONE DETECTED   Opiate, Ur Screen NONE DETECTED NONE DETECTED   Phencyclidine (PCP) Ur S NONE DETECTED NONE DETECTED   Cannabinoid 50 Ng, Ur [place] POSITIVE (A) NONE DETECTED   Barbiturates, Ur Screen NONE DETECTED NONE DETECTED   Benzodiazepine, Ur Scrn NONE DETECTED NONE DETECTED   Methadone Scn, Ur NONE DETECTED NONE DETECTED    Comment: (NOTE) Tricyclics + metabolites, urine    Cutoff 1000 ng/mL Amphetamines + metabolites, urine  Cutoff 1000 ng/mL MDMA (Ecstasy), urine              Cutoff 500 ng/mL Cocaine Metabolite, urine          Cutoff 300 ng/mL Opiate + metabolites, urine        Cutoff 300 ng/mL Phencyclidine (PCP), urine          Cutoff 25 ng/mL Cannabinoid, urine                 Cutoff 50 ng/mL Barbiturates + metabolites, urine  Cutoff 200 ng/mL Benzodiazepine, urine              Cutoff 200 ng/mL Methadone, urine                   Cutoff 300 ng/mL  The urine drug screen provides only a preliminary, unconfirmed analytical test result and should not be used for non-medical purposes. Clinical consideration and professional judgment should be applied to any positive drug screen result due to possible interfering substances. A more specific alternate chemical method must be used in order to obtain a confirmed analytical result. Gas chromatography / mass spectrometry (GC/MS) is the preferred confirm atory method. Performed at [facility], [address] [place], [place] [address]   Resp Panel by RT-PCR (Flu A&B, Covid) Nasopharyngeal Swab     Status: None   Collection Time: 12/07/21  1:03 AM   Specimen: Nasopharyngeal Swab; Nasopharyngeal(NP) swabs in vial transport medium  Result Value Ref Range   SARS Coronavirus 2 by RT PCR NEGATIVE NEGATIVE    Comment: (NOTE) SARS-CoV-2 target nucleic acids are NOT DETECTED.  The SARS-CoV-2 RNA is generally detectable in upper respiratory specimens during the acute phase of infection. The lowest concentration of SARS-CoV-2 viral copies this assay can detect is 138 copies/mL. A negative result does not preclude SARS-Cov-2 infection and should not be used as the sole basis for treatment or other patient management decisions. A negative result may occur with  improper specimen collection/handling, submission of specimen other than nasopharyngeal swab, presence of viral mutation(s) within the areas targeted by this assay, and inadequate number  of viral copies(<138 copies/mL). A negative result must be combined with clinical observations, patient history, and epidemiological information. The expected result is Negative.  Fact Sheet for Patients:   EntrepreneurPulse.com.au  Fact Sheet for Healthcare Providers:  IncredibleEmployment.be  This test is no t yet approved or cleared by the [place] FDA and  has been authorized for detection and/or diagnosis of SARS-CoV-2 by FDA under an Emergency Use Authorization (EUA). This EUA will remain  in effect (meaning this test can be used) for the duration of the COVID-19 declaration under Section 564(b)(1) of the Act, 21 U.S.C.section 360bbb-3(b)(1), unless the authorization is terminated  or revoked sooner.       Influenza A by PCR NEGATIVE NEGATIVE   Influenza B by PCR NEGATIVE NEGATIVE    Comment: (NOTE) The Xpert Xpress SARS-CoV-2/FLU/RSV plus assay is intended as an aid in the diagnosis of influenza from Nasopharyngeal swab specimens and should not be used as a sole basis for treatment. Nasal washings and aspirates are unacceptable for Xpert Xpress SARS-CoV-2/FLU/RSV testing.  Fact Sheet for Patients: EntrepreneurPulse.com.au  Fact Sheet for Healthcare Providers: IncredibleEmployment.be  This test is not yet approved or cleared by the [place] FDA and has been authorized for detection and/or diagnosis of SARS-CoV-2 by FDA under an Emergency Use Authorization (EUA). This EUA will remain in effect (meaning this test can be used) for the duration of the COVID-19 declaration under Section 564(b)(1) of the Act, 21 U.S.C. section 360bbb-3(b)(1), unless the authorization is terminated or revoked.  Performed at [facility], [address] [place], [place] [address]     Blood Alcohol level:  Lab Results  Component Value Date   ETH 269 (H) 02/18/2021   ETH <10 [id number]    Metabolic Disorder Labs:  Lab Results  Component Value Date   HGBA1C 4.9 03/13/2018   MPG 93.93 03/13/2018   Lab Results  Component Value Date   PROLACTIN 5.5 07/11/2016   PROLACTIN 31.8 (H) 03/26/2016    Lab Results  Component Value Date   CHOL 180 03/13/2018   TRIG 122 03/13/2018   HDL 109 03/13/2018   CHOLHDL 1.7 03/13/2018   VLDL 24 03/13/2018   LDLCALC 47 03/13/2018   LDLCALC 70 07/11/2016    Current Medications: Current Facility-Administered Medications  Medication Dose Route Frequency Provider Last Rate Last Admin   acetaminophen (TYLENOL) tablet 650 mg  650 mg Oral Q6H PRN Patrecia Pour, NP       alum & mag hydroxide-simeth (MAALOX/MYLANTA) 200-200-20 MG/5ML suspension 30 mL  30 mL Oral Q4H PRN Patrecia Pour, NP       benztropine (COGENTIN) tablet 1 mg  1 mg Oral Daily Lord, Jamison Y, NP       haloperidol (HALDOL) tablet 5 mg  5 mg Oral BID Patrecia Pour, NP       magnesium hydroxide (MILK OF MAGNESIA) suspension 30 mL  30 mL Oral Daily PRN Patrecia Pour, NP       nicotine (NICODERM CQ - dosed in mg/24 hours) patch 21 mg  21 mg Transdermal Daily Patrecia Pour, NP       PTA Medications: Medications Prior to Admission  Medication Sig Dispense Refill Last Dose   ARIPiprazole (ABILIFY) 30 MG tablet Take 1 tablet (30 mg total) by mouth daily. (Patient not taking: No sig reported) 30 tablet 0    ARIPiprazole ER 400 MG SUSR Inject 400 mg into the muscle every 28 (twenty-eight) days. Next dose on 04/09/2018 (  Patient not taking: No sig reported) 1 each 1     Musculoskeletal: Strength & Muscle Tone: within normal limits Gait & Station: normal Patient leans: N/A   Psychiatric Specialty Exam: Appearance:  AAM, appearing stated age;  wearing hospital clothes, with decreased grooming and normal hygiene. Normal level of alertness and appropriate facial expression.  Attitude/Behavior: calm, cooperative, engaging with appropriate eye contact.  Motor: WNL; dyskinesias not evident. Gait appears in full range.  Speech: spontaneous, clear, coherent, normal comprehension.  Mood: " I am fine ".  Affect: appropriately-reactive, restricted.  Thought process: patient appears  coherent and goal-directed.  Thought content: patient denies suicidal thoughts, denies homicidal thoughts; did not express any delusions at the time of the interview.  Thought perception: patient denies auditory and visual hallucinations. Did not appear internally stimulated.  Cognition: patient is alert and oriented in self, place, date.  Insight: fair, in regards of understanding of presence, nature, cause, and significance of mental or emotional problem.  Judgement: fair, in regards of ability to make good decisions concerning the appropriate thing to do in various situations, including ability to form opinions regarding their mental health condition.   Activity:Psychomotor Activity: Decreased   Assets  Assets:Communication Skills; Desire for Improvement; Financial Resources/Insurance; Housing; Physical Health; Resilience   Sleep  Sleep:Sleep: Fair    Physical Exam: Physical Exam Constitutional:      Appearance: Normal appearance.  HENT:     Head: Normocephalic and atraumatic.  Eyes:     Extraocular Movements: Extraocular movements intact.     Pupils: Pupils are equal, round, and reactive to light.  Cardiovascular:     Rate and Rhythm: Normal rate and regular rhythm.     Pulses: Normal pulses.     Heart sounds: Normal heart sounds.  Abdominal:     General: Abdomen is flat.     Palpations: Abdomen is soft.  Musculoskeletal:        General: Normal range of motion.     Cervical back: Normal range of motion.  Skin:    General: Skin is warm and dry.  Neurological:     General: No focal deficit present.     Mental Status: He is alert and oriented to person, place, and time.   Review of Systems  Constitutional:  Negative for chills and fever.  HENT:  Negative for hearing loss.   Eyes:  Negative for blurred vision.  Respiratory:  Negative for cough and shortness of breath.   Cardiovascular:  Negative for chest pain.  Gastrointestinal:  Negative for abdominal pain.   Neurological:  Negative for focal weakness.  Psychiatric/Behavioral:  Positive for depression and substance abuse. Negative for hallucinations, memory loss and suicidal ideas. The patient is nervous/anxious and has insomnia.   Blood pressure 129/84, pulse 84, temperature 98.3 F (36.8 C), temperature source Oral, height 5\' 11"  (1.803 m), weight 59 kg, SpO2 100 %. Body mass index is 18.13 kg/m.  Treatment Plan Summary: Daily contact with patient to assess and evaluate symptoms and progress in treatment  ASSESSMENT: Patient is seen and examined.  Patient is a 30 year old male with the above-stated past psychiatric and medical history who was admitted secondary to suicidal thoughts and possible psychosis in settings of non-compliance with medications.  PLAN: -inpatient psychiatric admission will be continued. -patient will be integrated in the milieu.   -patient will be encouraged to attend groups.    -Medications:  We will continue started in ER Haldol 5mg  PO BID as the medicine was effective for  the patient in the past. Will start low-dose Mirtazapine for depression and sleep.   benztropine  1 mg Oral Daily   haloperidol  5 mg Oral BID   mirtazapine  7.5 mg Oral QHS   nicotine  21 mg Transdermal Daily   -will monitor for sx of acute alcohol withdrawal.  -Disposition will be determined after the patient is stabilized.   Observation Level/Precautions:  15 minute checks  Laboratory:      Psychotherapy:    Medications:    Consultations:    Discharge Concerns:    Estimated LOS:  Other:     Physician Treatment Plan for Primary Diagnosis: Schizoaffective disorder, bipolar type ([place]) Long Term Goal(s): Improvement in symptoms so as ready for discharge  Short Term Goals: Ability to identify changes in lifestyle to reduce recurrence of condition will improve, Ability to verbalize feelings will improve, Ability to disclose and discuss suicidal ideas, Ability to demonstrate  self-control will improve, Ability to identify and develop effective coping behaviors will improve, Ability to maintain clinical measurements within normal limits will improve, Compliance with prescribed medications will improve, and Ability to identify triggers associated with substance abuse/mental health issues will improve  Physician Treatment Plan for Secondary Diagnosis: Principal Problem:   Schizoaffective disorder, bipolar type ([place])  Long Term Goal(s): Improvement in symptoms so as ready for discharge  Short Term Goals: Ability to identify changes in lifestyle to reduce recurrence of condition will improve, Ability to verbalize feelings will improve, Ability to disclose and discuss suicidal ideas, Ability to demonstrate self-control will improve, Ability to identify and develop effective coping behaviors will improve, Ability to maintain clinical measurements within normal limits will improve, Compliance with prescribed medications will improve, and Ability to identify triggers associated with substance abuse/mental health issues will improve  I certify that inpatient services furnished can reasonably be expected to improve the patient's condition.    Larita Fife, MD 12/24/202212:44 PM

## 2021-12-08 NOTE — BH Assessment (Signed)
Patient is to be admitted to [facility] by Psychiatric Nurse Practitioner Nanine Means.  Attending Physician will be  Catha Nottingham, NP .   Patient has been assigned to room 319, by [facility] Charge Nurse, Lisette Abu.    ER staff is aware of the admission: Luann, ER Secretary   Dr. Marisa Severin, ER MD  Nicole Cella, Patient's Nurse

## 2021-12-08 NOTE — ED Notes (Signed)
IVC / pending reassessment in the AM. 

## 2021-12-08 NOTE — BHH Suicide Risk Assessment (Signed)
[facility] Admission Suicide Risk Assessment   Nursing information obtained from:  Patient Demographic factors:  Male Current Mental Status:  NA Loss Factors:  NA Historical Factors:  NA Risk Reduction Factors:  NA   Principal Problem: Schizoaffective disorder, bipolar type (HCC) Diagnosis:  Principal Problem:   Schizoaffective disorder, bipolar type ([facility])  Subjective Data:   Patient is a 30yo man with psychiatric history of Schizoaffective, who was admitted to [facility] unit due to suicidal ideations in settings of being off psych medications.   SUICIDE RISK:   Mild:  Suicidal ideation of limited frequency, intensity, duration, and specificity.  There are no identifiable plans, no associated intent, mild dysphoria and related symptoms, good self-control (both objective and subjective assessment), few other risk factors, and identifiable protective factors, including available and accessible social support.  PLAN OF CARE:   -inpatient psychiatric admission will be continued. -patient will be integrated in the milieu.   -patient will be encouraged to attend groups.     -Medications:  We will continue started in ER Haldol 5mg  PO BID as the medicine was effective for the patient in the past. Will start low-dose Mirtazapine for depression and sleep.   -will monitor for sx of acute alcohol withdrawal.   -Disposition will be determined after the patient is stabilized.   I certify that inpatient services furnished can reasonably be expected to improve the patient's condition.   , MD 12/08/2021, 2:05 PM

## 2021-12-08 NOTE — ED Notes (Signed)
VS not taken, patient asleep 

## 2021-12-08 NOTE — Group Note (Signed)
LCSW Group Therapy Note  Group Date: 12/08/2021 Start Time: 1320 End Time: 1420   Type of Therapy and Topic:  Group Therapy - Healthy vs Unhealthy Coping Skills  Participation Level:  Active   Description of Group The focus of this group was to determine what unhealthy coping techniques typically are used by group members and what healthy coping techniques would be helpful in coping with various problems. Patients were guided in becoming aware of the differences between healthy and unhealthy coping techniques. Patients were asked to identify 2-3 healthy coping skills they would like to learn to use more effectively.  Therapeutic Goals Patients learned that coping is what human beings do all day long to deal with various situations in their lives Patients defined and discussed healthy vs unhealthy coping techniques Patients identified their preferred coping techniques and identified whether these were healthy or unhealthy Patients determined 2-3 healthy coping skills they would like to become more familiar with and use more often. Patients provided support and ideas to each other   Summary of Patient Progress: Patient was present for the entirety of the group session. Patient was an active listener and participated in the topic of discussion, provided helpful advice to others, and added nuance to topic of conversation. Patient shared that he would like to practice writing his feelings down more often, sharing that when he confides in others it often leads to arguments. Patient states he knows that he needs to get out how he feels so his emotions do not become bottled up.    Therapeutic Modalities Cognitive Behavioral Therapy Motivational Interviewing  Norberto Sorenson, Theresia Majors 12/08/2021  5:20 PM

## 2021-12-09 MED ORDER — MIRTAZAPINE 15 MG PO TABS
15.0000 mg | ORAL_TABLET | Freq: Every day | ORAL | Status: DC
  Administered 2021-12-09 – 2021-12-10 (×2): 15 mg via ORAL
  Filled 2021-12-09 (×2): qty 1

## 2021-12-09 NOTE — Plan of Care (Signed)
°  Problem: Education: Goal: Knowledge of [facility] Education information/materials will improve Outcome: Progressing Goal: Emotional status will improve Outcome: Progressing Goal: Mental status will improve Outcome: Progressing Goal: Verbalization of understanding the information provided will improve Outcome: Progressing   Problem: Health Behavior/Discharge Planning: Goal: Compliance with treatment plan for underlying cause of condition will improve Outcome: Progressing   Problem: Physical Regulation: Goal: Ability to maintain clinical measurements within normal limits will improve Outcome: Progressing   Problem: Safety: Goal: Periods of time without injury will increase Outcome: Progressing
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
## 2021-12-09 NOTE — Group Note (Signed)
LCSW Group Therapy Note  Group Date: 12/09/2021 Start Time: 1330 End Time: 1430   Type of Therapy and Topic:  Group Therapy - How To Cope with Nervousness about Discharge   Participation Level:  Active   Description of Group This process group involved identification of patients' feelings about discharge. Some of them are scheduled to be discharged soon, while others are new admissions, but each of them was asked to share thoughts and feelings surrounding discharge from the hospital. One common theme was that they are excited at the prospect of going home, while another was that many of them are apprehensive about sharing why they were hospitalized. Patients were given the opportunity to discuss these feelings with their peers in preparation for discharge.  Therapeutic Goals  Patient will identify their overall feelings about pending discharge. Patient will think about how they might proactively address issues that they believe will once again arise once they get home (i.e. with parents). Patients will participate in discussion about having hope for change.   Summary of Patient Progress: Patient was present for the entirety of the group session. Patient was an active listener and participated in the topic of discussion, provided helpful advice to others, and added nuance to topic of conversation. Patient was observed completing the worksheet that supplemented group discussion. Patient expressed that he is feeling neutral about being hospitalized on a holiday. Patient expressed how he needed to become stabilized on medication and is glad that he is addressing his mental health. Patient expressed the goal to stay away from alcohol, sharing that it is difficult to avoid the "temptation" due to being around "negative people" and feeling like he is "running out of options to get away." Patient processed how he is often too focused on others instead of himself and continued to express interest in  inpatient substance abuse treatment.     Therapeutic Modalities Cognitive Behavioral Therapy   Marletta Lor 12/09/2021  4:50 PM
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
## 2021-12-09 NOTE — Progress Notes (Signed)
Patient alert and oriented. Patient denies pain. Patient denies anxiety and depression. Patient denies SI/HI/AVH. Patient calm and cooperative. Patient compliant with medication administration. Patient in a pleasant mood. Patient states he is having a good day. Q15 minute safety checks maintained. Patient remains safe on the unit at this time.

## 2021-12-09 NOTE — Progress Notes (Signed)
[facility] MD Progress Note  12/09/2021 11:31 AM Joshua [name]  MRN:  [id number]  Principal Problem: Schizoaffective disorder, bipolar type (HCC) Diagnosis: Principal Problem:   Schizoaffective disorder, bipolar type (HCC)  Patient is a 30yo man with psychiatric history of Schizoaffective, who was admitted to [facility] unit due to suicidal ideations in settings of being off psych medications.   Interval History Patient was seen today for re-evaluation.  Nursing reports no events overnight. The patient has no issues with performing ADLs.  Patient has been medication compliant.    Subjective:  On assessment patient reports "I feel good". He reports feeling less depressed and less anxious. His sleep is better, but he is willing to go higher dose of the "new medication" (Mirtazapine). He denies feeling suicidal or homicidal. He denies auditory or visual hallucinations. He expresses interest in substance-abuse treatment program to address his drinking and drug use (cannabis and cocaine). Denies any current symptoms of alcohol, withdrawal. Denies side effects from the medications.   Labs: no new results for review.   Total Time spent with patient: 20 minutes  Past Psychiatric History: Schizoaffective disorder, alcohol use disorder. Several past psych admission. Past psych medications: Haldol, Abilify.     Past Medical History:  Past Medical History:  Diagnosis Date   Bipolar 1 disorder (HCC)    Schizophrenia (HCC)    History reviewed. No pertinent surgical history. Family History: History reviewed. No pertinent family history. Family Psychiatric  History: see H&P  Social History:  Social History   Substance and Sexual Activity  Alcohol Use Not Currently   Alcohol/week: 20.0 standard drinks   Types: 20 Cans of beer per week   Comment: 12/22/16- states he drank "a lot last night"     Social History   Substance and Sexual Activity  Drug Use Yes   Types: Marijuana   Comment: crack, cocaine     Social History   Socioeconomic History   Marital status: Single    Spouse name: Not on file   Number of children: Not on file   Years of education: Not on file   Highest education level: Not on file  Occupational History   Not on file  Tobacco Use   Smoking status: Every Day    Packs/day: 1.00    Years: 5.00    Pack years: 5.00    Types: Cigarettes   Smokeless tobacco: Never  Substance and Sexual Activity   Alcohol use: Not Currently    Alcohol/week: 20.0 standard drinks    Types: 20 Cans of beer per week    Comment: 12/22/16- states he drank "a lot last night"   Drug use: Yes    Types: Marijuana    Comment: crack, cocaine   Sexual activity: Yes    Birth control/protection: None  Other Topics Concern   Not on file  Social History Narrative   Not on file   Social Determinants of Health   Financial Resource Strain: Not on file  Food Insecurity: Not on file  Transportation Needs: Not on file  Physical Activity: Not on file  Stress: Not on file  Social Connections: Not on file   Additional Social History:     Sleep: Fair  Appetite:  Good  Current Medications: Current Facility-Administered Medications  Medication Dose Route Frequency Provider Last Rate Last Admin   acetaminophen (TYLENOL) tablet 650 mg  650 mg Oral Q6H PRN Charm Rings, NP       alum & mag hydroxide-simeth (MAALOX/MYLANTA) 200-200-20 MG/5ML suspension  30 mL  30 mL Oral Q4H PRN Charm Rings, NP       benztropine (COGENTIN) tablet 1 mg  1 mg Oral Daily Charm Rings, NP   1 mg at 12/09/21 0813   haloperidol (HALDOL) tablet 5 mg  5 mg Oral BID Charm Rings, NP   5 mg at 12/09/21 0813   magnesium hydroxide (MILK OF MAGNESIA) suspension 30 mL  30 mL Oral Daily PRN Charm Rings, NP       mirtazapine (REMERON) tablet 7.5 mg  7.5 mg Oral QHS Anthany Thornhill, Serina Cowper, MD   7.5 mg at 12/08/21 2101   nicotine (NICODERM CQ - dosed in mg/24 hours) patch 21 mg  21 mg Transdermal Daily Charm Rings, NP    21 mg at 12/09/21 7829    Lab Results: No results found for this or any previous visit (from the past 48 hour(s)).  Blood Alcohol level:  Lab Results  Component Value Date   ETH 269 (H) 02/18/2021   ETH <10 05/07/2019    Metabolic Disorder Labs: Lab Results  Component Value Date   HGBA1C 4.9 03/13/2018   MPG 93.93 03/13/2018   Lab Results  Component Value Date   PROLACTIN 5.5 07/11/2016   PROLACTIN 31.8 (H) 03/26/2016   Lab Results  Component Value Date   CHOL 180 03/13/2018   TRIG 122 03/13/2018   HDL 109 03/13/2018   CHOLHDL 1.7 03/13/2018   VLDL 24 03/13/2018   LDLCALC 47 03/13/2018   LDLCALC 70 07/11/2016    Physical Findings: AIMS:  , ,  ,  ,    CIWA:    COWS:     Musculoskeletal: Strength & Muscle Tone: within normal limits Gait & Station: normal Patient leans: N/A  Psychiatric Specialty Exam: Appearance:  AAM, appearing stated age;  wearing hospital clothes, with decreased grooming and normal hygiene. Normal level of alertness and appropriate facial expression.   Attitude/Behavior: calm, cooperative, engaging with appropriate eye contact.   Motor: WNL; dyskinesias not evident. Gait appears in full range.   Speech: spontaneous, clear, coherent, normal comprehension.   Mood: " I am fine ".   Affect: appropriately-reactive, restricted.   Thought process: patient appears coherent and goal-directed.   Thought content: patient denies suicidal thoughts, denies homicidal thoughts; did not express any delusions at the time of the interview.   Thought perception: patient denies auditory and visual hallucinations. Did not appear internally stimulated.   Cognition: patient is alert and oriented in self, place, date.   Insight: fair, in regards of understanding of presence, nature, cause, and significance of mental or emotional problem.   Judgement: fair, in regards of ability to make good decisions concerning the appropriate thing to do in various  situations, including ability to form opinions regarding their mental health condition.     Assets  Assets:Communication Skills; Desire for Improvement; Financial Resources/Insurance; Housing; Physical Health; Resilience    Physical Exam: Physical Exam ROS Blood pressure 116/77, pulse 60, temperature 97.8 F (36.6 C), temperature source Oral, resp. rate 17, height 5\' 11"  (1.803 m), weight 59 kg, SpO2 100 %. Body mass index is 18.13 kg/m.   Treatment Plan Summary: Daily contact with patient to assess and evaluate symptoms and progress in treatment and Medication management  Patient is a 30 year old male with the above-stated past psychiatric history who is seen in follow-up.  Chart reviewed. Patient discussed with nursing. Patient reports improvement of mood and psychotic symptoms after his medications were restarted. Sleep  is still not too good. He is interested in substance-abuse treatment program referral.     Plan:  -continue inpatient psych admission; 15-minute checks; daily contact with patient to assess and evaluate symptoms and progress in treatment; psychoeducation.  -Medications: continue Haldol 5mg  PO BID for mood and psychotic sx. increase Mirtazapine to 15mg  PO QHS for depression and sleep.    benztropine  1 mg Oral Daily   haloperidol  5 mg Oral BID   mirtazapine  15 mg Oral QHS   nicotine  21 mg Transdermal Daily   -continue PRN medications. acetaminophen, alum & mag hydroxide-simeth, magnesium hydroxide  -Pertinent Labs: no new labs ordered today  -Disposition: Estimated duration of hospitalization: early-to-midweek next week. Social worker to help patient with SUD program referrals. All necessary aftercare will be arranged prior to discharge Likely d/c to SUD program vs. home with outpatient psych follow-up.  -  I certify that the patient does need, on a daily basis, active treatment furnished directly by or requiring the supervision of inpatient psychiatric  facility personnel.    , MD 12/09/2021, 11:31 AM

## 2021-12-10 NOTE — Plan of Care (Signed)
Met with Pt in pt rm. Pt is resting with eyes closed and is awoken by Probation officer. Pt is minimal on assessment and slow to respond. Pt denies SI / HI / AVH. Pt asks when breakfast will be available. No signs of distress or injury. Affect and mood are appropriate. Staff will continue to monitor per protocol.    Problem: Education: Goal: Knowledge of [facility] Education information/materials will improve Outcome: Not Progressing Goal: Emotional status will improve Outcome: Not Progressing Goal: Mental status will improve Outcome: Not Progressing

## 2021-12-10 NOTE — [facility] Counselor (Signed)
CSW has faxed referrals for [facility], [facility] and [facility].  CSW received confirmation that the faxes were successful.  Penni Homans, MSW, LCSW 12/10/2021 2:27 PM

## 2021-12-10 NOTE — Progress Notes (Signed)
Patient was pleasant on approach, he was cooperative with treatment on shift, medication compliant, visible in the dayroom interacting appropriately with peers during the evening. He had no behavioral issues to report on shift at this time. Patient denies SI/HI/AVH & anxiety and depression. Patient seemed to rest well through out the night.

## 2021-12-10 NOTE — BH IP Treatment Plan (Unsigned)
Interdisciplinary Treatment and Diagnostic Plan Update  12/10/2021 Time of Session: 9:00AM Joshua [name] MRN: [id number]  Principal Diagnosis: Schizoaffective disorder, bipolar type (HCC)  Secondary Diagnoses: Principal Problem:   Schizoaffective disorder, bipolar type (HCC)   Current Medications:  Current Facility-Administered Medications  Medication Dose Route Frequency Provider Last Rate Last Admin   acetaminophen (TYLENOL) tablet 650 mg  650 mg Oral Q6H PRN Charm Rings, NP       alum & mag hydroxide-simeth (MAALOX/MYLANTA) 200-200-20 MG/5ML suspension 30 mL  30 mL Oral Q4H PRN Charm Rings, NP       benztropine (COGENTIN) tablet 1 mg  1 mg Oral Daily Charm Rings, NP   1 mg at 12/10/21 4765   haloperidol (HALDOL) tablet 5 mg  5 mg Oral BID Charm Rings, NP   5 mg at 12/10/21 0744   magnesium hydroxide (MILK OF MAGNESIA) suspension 30 mL  30 mL Oral Daily PRN Charm Rings, NP       mirtazapine (REMERON) tablet 15 mg  15 mg Oral QHS Thalia Party, MD   15 mg at 12/09/21 2123   nicotine (NICODERM CQ - dosed in mg/24 hours) patch 21 mg  21 mg Transdermal Daily Charm Rings, NP   21 mg at 12/10/21 0745   PTA Medications: Medications Prior to Admission  Medication Sig Dispense Refill Last Dose   ARIPiprazole (ABILIFY) 30 MG tablet Take 1 tablet (30 mg total) by mouth daily. (Patient not taking: No sig reported) 30 tablet 0    ARIPiprazole ER 400 MG SUSR Inject 400 mg into the muscle every 28 (twenty-eight) days. Next dose on 04/09/2018 (Patient not taking: No sig reported) 1 each 1     Patient Stressors: Substance abuse    Patient Strengths: Ability for insight   Treatment Modalities: Medication Management, Group therapy, Case management,  1 to 1 session with clinician, Psychoeducation, Recreational therapy.   Physician Treatment Plan for Primary Diagnosis: Schizoaffective disorder, bipolar type (HCC) Long Term Goal(s): Improvement in symptoms so as ready for  discharge   Short Term Goals: Ability to identify changes in lifestyle to reduce recurrence of condition will improve Ability to verbalize feelings will improve Ability to disclose and discuss suicidal ideas Ability to demonstrate self-control will improve Ability to identify and develop effective coping behaviors will improve Ability to maintain clinical measurements within normal limits will improve Compliance with prescribed medications will improve Ability to identify triggers associated with substance abuse/mental health issues will improve  Medication Management: Evaluate patient's response, side effects, and tolerance of medication regimen.  Therapeutic Interventions: 1 to 1 sessions, Unit Group sessions and Medication administration.  Evaluation of Outcomes: Progressing  Physician Treatment Plan for Secondary Diagnosis: Principal Problem:   Schizoaffective disorder, bipolar type (HCC)  Long Term Goal(s): Improvement in symptoms so as ready for discharge   Short Term Goals: Ability to identify changes in lifestyle to reduce recurrence of condition will improve Ability to verbalize feelings will improve Ability to disclose and discuss suicidal ideas Ability to demonstrate self-control will improve Ability to identify and develop effective coping behaviors will improve Ability to maintain clinical measurements within normal limits will improve Compliance with prescribed medications will improve Ability to identify triggers associated with substance abuse/mental health issues will improve     Medication Management: Evaluate patient's response, side effects, and tolerance of medication regimen.  Therapeutic Interventions: 1 to 1 sessions, Unit Group sessions and Medication administration.  Evaluation of Outcomes: Progressing   RN  Treatment Plan for Primary Diagnosis: Schizoaffective disorder, bipolar type (HCC) Long Term Goal(s): Knowledge of disease and therapeutic regimen  to maintain health will improve  Short Term Goals: Ability to demonstrate self-control, Ability to participate in decision making will improve, Ability to verbalize feelings will improve, Ability to disclose and discuss suicidal ideas, Ability to identify and develop effective coping behaviors will improve, and Compliance with prescribed medications will improve  Medication Management: RN will administer medications as ordered by provider, will assess and evaluate patient's response and provide education to patient for prescribed medication. RN will report any adverse and/or side effects to prescribing provider.  Therapeutic Interventions: 1 on 1 counseling sessions, Psychoeducation, Medication administration, Evaluate responses to treatment, Monitor vital signs and CBGs as ordered, Perform/monitor CIWA, COWS, AIMS and Fall Risk screenings as ordered, Perform wound care treatments as ordered.  Evaluation of Outcomes: Progressing   LCSW Treatment Plan for Primary Diagnosis: Schizoaffective disorder, bipolar type (HCC) Long Term Goal(s): Safe transition to appropriate next level of care at discharge, Engage patient in therapeutic group addressing interpersonal concerns.  Short Term Goals: Engage patient in aftercare planning with referrals and resources, Increase social support, Increase ability to appropriately verbalize feelings, Increase emotional regulation, Facilitate acceptance of mental health diagnosis and concerns, and Increase skills for wellness and recovery  Therapeutic Interventions: Assess for all discharge needs, 1 to 1 time with Social worker, Explore available resources and support systems, Assess for adequacy in community support network, Educate family and significant other(s) on suicide prevention, Complete Psychosocial Assessment, Interpersonal group therapy.  Evaluation of Outcomes: Progressing   Progress in Treatment: Attending groups: Yes. Participating in groups:  Yes. Taking medication as prescribed: Yes. Toleration medication: Yes. Family/Significant other contact made: Yes, individual(s) contacted:  SPE completed with the patient's significant other. Patient understands diagnosis: Yes. Discussing patient identified problems/goals with staff: Yes. Medical problems stabilized or resolved: Yes. Denies suicidal/homicidal ideation: Yes. Issues/concerns per patient self-inventory: No. Other: none  New problem(s) identified: No, Describe:  none  New Short Term/Long Term Goal(s): detox, medication management for mood stabilization; elimination of SI thoughts; development of comprehensive mental wellness/sobriety plan.   Patient Goals:  "get back on my medications"  Discharge Plan or Barriers: CSW will assist patient with the development of an appropriate discharge plan.  Reason for Continuation of Hospitalization: Anxiety Depression Medication stabilization Withdrawal symptoms  Estimated Length of Stay:  1-7 days   Scribe for Treatment Team: Harden Mo, Alexander Mt 12/10/2021 9:59 AM

## 2021-12-10 NOTE — Progress Notes (Signed)
Recreation Therapy Notes  Date: 12/10/2021  Time: 10:00 am    Location: Craft room    Behavioral response: Appropriate   Intervention Topic: Relaxation    Discussion/Intervention:  Group content today was focused on relaxation. The group defined relaxation and identified healthy ways to relax. Individuals expressed how much time they spend relaxing. Patients expressed how much their life would be if they did not make time for themselves to relax. The group stated ways they could improve their relaxation techniques in the future.  Individuals participated in the intervention Time to Relax where they had a chance to experience different relaxation techniques.   Clinical Observations/Feedback: Patient came to group and defined relaxation as doing something you enjoy and chilling. He stated that he likes to listen to music and sleep to relax. Participant identified being stressed as what stops him from relaxing. Individual was social with peers and staff while participating in the intervention.    Delson Dulworth LRT/CTRS          Jacari Iannello 12/10/2021 12:16 PM

## 2021-12-10 NOTE — Progress Notes (Signed)
[facility] MD Progress Note  12/10/2021 10:33 AM Joshua [name]  MRN:  [id number]  Principal Problem: Schizoaffective disorder, bipolar type (HCC) Diagnosis: Principal Problem:   Schizoaffective disorder, bipolar type (HCC)  Patient is a 30yo man with psychiatric history of Schizoaffective, who was admitted to [facility] unit due to suicidal ideations in settings of being off psych medications.   Interval History Patient was seen today for re-evaluation.  Nursing reports no events overnight. The patient has no issues with performing ADLs.  Patient has been medication compliant.    Subjective:  On assessment patient reports "I feel very good". He reports feeling not depressed and not anxious anymore. His sleep has improved with Mirtazapine. He denies feeling suicidal or homicidal. He denies auditory or visual hallucinations. He continues to express interest in substance-abuse treatment program to address his drinking and drug use (cannabis and cocaine). Denies any current symptoms of alcohol, withdrawal. Denies side effects from the medications.   Labs: no new results for review.   Total Time spent with patient: 20 minutes  Past Psychiatric History: Schizoaffective disorder, alcohol use disorder. Several past psych admission. Past psych medications: Haldol, Abilify.     Past Medical History:  Past Medical History:  Diagnosis Date   Bipolar 1 disorder (HCC)    Schizophrenia (HCC)    History reviewed. No pertinent surgical history. Family History: History reviewed. No pertinent family history. Family Psychiatric  History: see H&P  Social History:  Social History   Substance and Sexual Activity  Alcohol Use Not Currently   Alcohol/week: 20.0 standard drinks   Types: 20 Cans of beer per week   Comment: 12/22/16- states he drank "a lot last night"     Social History   Substance and Sexual Activity  Drug Use Yes   Types: Marijuana   Comment: crack, cocaine    Social History    Socioeconomic History   Marital status: Single    Spouse name: Not on file   Number of children: Not on file   Years of education: Not on file   Highest education level: Not on file  Occupational History   Not on file  Tobacco Use   Smoking status: Every Day    Packs/day: 1.00    Years: 5.00    Pack years: 5.00    Types: Cigarettes   Smokeless tobacco: Never  Substance and Sexual Activity   Alcohol use: Not Currently    Alcohol/week: 20.0 standard drinks    Types: 20 Cans of beer per week    Comment: 12/22/16- states he drank "a lot last night"   Drug use: Yes    Types: Marijuana    Comment: crack, cocaine   Sexual activity: Yes    Birth control/protection: None  Other Topics Concern   Not on file  Social History Narrative   Not on file   Social Determinants of Health   Financial Resource Strain: Not on file  Food Insecurity: Not on file  Transportation Needs: Not on file  Physical Activity: Not on file  Stress: Not on file  Social Connections: Not on file   Additional Social History:     Sleep: Fair  Appetite:  Good  Current Medications: Current Facility-Administered Medications  Medication Dose Route Frequency Provider Last Rate Last Admin   acetaminophen (TYLENOL) tablet 650 mg  650 mg Oral Q6H PRN Charm Rings, NP       alum & mag hydroxide-simeth (MAALOX/MYLANTA) 200-200-20 MG/5ML suspension 30 mL  30 mL Oral Q4H  PRN Charm Rings, NP       benztropine (COGENTIN) tablet 1 mg  1 mg Oral Daily Charm Rings, NP   1 mg at 12/10/21 7342   haloperidol (HALDOL) tablet 5 mg  5 mg Oral BID Charm Rings, NP   5 mg at 12/10/21 0744   magnesium hydroxide (MILK OF MAGNESIA) suspension 30 mL  30 mL Oral Daily PRN Charm Rings, NP       mirtazapine (REMERON) tablet 15 mg  15 mg Oral QHS Thalia Party, MD   15 mg at 12/09/21 2123   nicotine (NICODERM CQ - dosed in mg/24 hours) patch 21 mg  21 mg Transdermal Daily Charm Rings, NP   21 mg at 12/10/21 0745     Lab Results: No results found for this or any previous visit (from the past 48 hour(s)).  Blood Alcohol level:  Lab Results  Component Value Date   ETH 269 (H) 02/18/2021   ETH <10 05/07/2019    Metabolic Disorder Labs: Lab Results  Component Value Date   HGBA1C 4.9 03/13/2018   MPG 93.93 03/13/2018   Lab Results  Component Value Date   PROLACTIN 5.5 07/11/2016   PROLACTIN 31.8 (H) 03/26/2016   Lab Results  Component Value Date   CHOL 180 03/13/2018   TRIG 122 03/13/2018   HDL 109 03/13/2018   CHOLHDL 1.7 03/13/2018   VLDL 24 03/13/2018   LDLCALC 47 03/13/2018   LDLCALC 70 07/11/2016    Physical Findings: AIMS:  , ,  ,  ,    CIWA:    COWS:     Musculoskeletal: Strength & Muscle Tone: within normal limits Gait & Station: normal Patient leans: N/A  Psychiatric Specialty Exam: Appearance:  AAM, appearing stated age;  wearing hospital clothes, with decreased grooming and normal hygiene. Normal level of alertness and appropriate facial expression.   Attitude/Behavior: calm, cooperative, engaging with appropriate eye contact.   Motor: WNL; dyskinesias not evident. Gait appears in full range.   Speech: spontaneous, clear, coherent, normal comprehension.   Mood: " I am fine ".   Affect: appropriately-reactive, restricted.   Thought process: patient appears coherent and goal-directed.   Thought content: patient denies suicidal thoughts, denies homicidal thoughts; did not express any delusions at the time of the interview.   Thought perception: patient denies auditory and visual hallucinations. Did not appear internally stimulated.   Cognition: patient is alert and oriented in self, place, date.   Insight: fair, in regards of understanding of presence, nature, cause, and significance of mental or emotional problem.   Judgement: fair, in regards of ability to make good decisions concerning the appropriate thing to do in various situations, including ability  to form opinions regarding their mental health condition.     Assets  Assets:Communication Skills; Desire for Improvement; Financial Resources/Insurance; Housing; Physical Health; Resilience    Physical Exam: Physical Exam ROS Blood pressure 112/67, pulse (!) 59, temperature 98 F (36.7 C), temperature source Oral, resp. rate 18, height 5\' 11"  (1.803 m), weight 59 kg, SpO2 100 %. Body mass index is 18.13 kg/m.   Treatment Plan Summary: Daily contact with patient to assess and evaluate symptoms and progress in treatment and Medication management  Patient is a 30 year old male with the above-stated past psychiatric history who is seen in follow-up.  Chart reviewed. Patient discussed with nursing. Patient reports improvement of mood and psychotic symptoms after his medications were restarted. Sleep is better too. He is interested  in substance-abuse treatment program referral; no referrals can be made today due to an official holiday. SW will continue working on that tomorrow and patient likely will be ready for discharge.     Plan:  -continue inpatient psych admission; 15-minute checks; daily contact with patient to assess and evaluate symptoms and progress in treatment; psychoeducation.  -Medications: continue Haldol 5mg  PO BID for mood and psychotic sx. increase Mirtazapine to 15mg  PO QHS for depression and sleep.    benztropine  1 mg Oral Daily   haloperidol  5 mg Oral BID   mirtazapine  15 mg Oral QHS   nicotine  21 mg Transdermal Daily   -continue PRN medications. acetaminophen, alum & mag hydroxide-simeth, magnesium hydroxide  -Pertinent Labs: no new labs ordered today  -Disposition: Estimated duration of hospitalization: this week. Needs referrals to SUD treatment program, that can be done tomorrow. All necessary aftercare will be arranged prior to discharge Likely d/c to SUD program vs. home with outpatient psych follow-up.  -  I certify that the patient does need, on a  daily basis, active treatment furnished directly by or requiring the supervision of inpatient psychiatric facility personnel.    , MD 12/10/2021, 10:33 AM

## 2021-12-11 ENCOUNTER — Other Ambulatory Visit: Payer: Self-pay

## 2021-12-11 MED ORDER — BENZTROPINE MESYLATE 1 MG PO TABS: 1.0000 mg | ORAL_TABLET | Freq: Every day | ORAL | 2 refills | Status: AC

## 2021-12-11 MED ORDER — MIRTAZAPINE 15 MG PO TABS: 15.0000 mg | ORAL_TABLET | Freq: Every day | ORAL | 2 refills | Status: AC

## 2021-12-11 MED ORDER — NICOTINE 21 MG/24HR TD PT24: 21.0000 mg | MEDICATED_PATCH | Freq: Every day | TRANSDERMAL | 2 refills | Status: AC

## 2021-12-11 MED ORDER — HALOPERIDOL 5 MG PO TABS: 5.0000 mg | ORAL_TABLET | Freq: Two times a day (BID) | ORAL | 2 refills | Status: AC

## 2021-12-11 NOTE — Discharge Summary (Signed)
Physician Discharge Summary Note  Patient:  Joshua [name] is an 30 y.o., male MRN:  [id number] DOB:  04-14-1991 Patient phone:  [phone] (home)  Patient address:   [place] [address],  Total Time spent with patient: 45 minutes  Date of Admission:  12/08/2021 Date of Discharge: 12/11/2021  Reason for Admission:  psychosis and substance abuse  Principal Problem: Schizoaffective disorder, bipolar type [facility]) Discharge Diagnoses: Principal Problem:   Schizoaffective disorder, bipolar type ([place])   Past Psychiatric History: schizoaffective disorder, bipolar disorder  Past Medical History:  Past Medical History:  Diagnosis Date   Bipolar 1 disorder ([place])    Schizophrenia ([place])    History reviewed. No pertinent surgical history. Family History: History reviewed. No pertinent family history. Family Psychiatric  History: none Social History:  Social History   Substance and Sexual Activity  Alcohol Use Not Currently   Alcohol/week: 20.0 standard drinks   Types: 20 Cans of beer per week   Comment: 12/22/16- states he drank "a lot last night"     Social History   Substance and Sexual Activity  Drug Use Yes   Types: Marijuana   Comment: crack, cocaine    Social History   Socioeconomic History   Marital status: Single    Spouse name: Not on file   Number of children: Not on file   Years of education: Not on file   Highest education level: Not on file  Occupational History   Not on file  Tobacco Use   Smoking status: Every Day    Packs/day: 1.00    Years: 5.00    Pack years: 5.00    Types: Cigarettes   Smokeless tobacco: Never  Substance and Sexual Activity   Alcohol use: Not Currently    Alcohol/week: 20.0 standard drinks    Types: 20 Cans of beer per week    Comment: 12/22/16- states he drank "a lot last night"   Drug use: Yes    Types: Marijuana    Comment: crack, cocaine   Sexual activity: Yes    Birth control/protection: None  Other  Topics Concern   Not on file  Social History Narrative   Not on file   Social Determinants of Health   Financial Resource Strain: Not on file  Food Insecurity: Not on file  Transportation Needs: Not on file  Physical Activity: Not on file  Stress: Not on file  Social Connections: Not on file    Hospital Course:   On admission 12/24: Per chart, patient was initially brought to the ED due to alcohol intoxication. His urine was also found to be positive for cocaine and cannabis. Reportedly, his mother called to also report that he has been voicing suicidal ideation for the past few days.  He has not been taking any medications, he recently lost his job, is currently homeless, and she is very fearful for his safety. Per psych consult note, the patient was presenting with some delusional thinking. Patient reportedly agreed to to admission to the [facility] at [facility] for stabilization, although his mother already IVC'd him as she had concerns that he was suicidal.     Patient seen. Patient reports he is "doing better". He says he was agreeable for this admission and thinks he needs to be on psych medications. He reports feeling depressed and anxious; stressors - issues with employment. He complains about insomnia. He denies feeling suicidal or homicidal. He denies auditory or visual hallucinations. He admits to using cannabis and cocaine  on occasion. He admits to drinking, states he does not drink much. Denies any current symptoms of alcohol, withdrawal. He agreed to continue Haldol, started in the ER. States it was helpful for his mood in the past. Denies side effects from the medications.  Medications:  haldol 5 mg BID, Cogentin 1 mg daily EPS  12/25: On assessment patient reports "I feel good". He reports feeling less depressed and less anxious. His sleep is better, but he is willing to go higher dose of the "new medication" (Mirtazapine). He denies feeling suicidal or homicidal. He denies auditory or  visual hallucinations. He expresses interest in substance-abuse treatment program to address his drinking and drug use (cannabis and cocaine). Denies any current symptoms of alcohol, withdrawal. Denies side effects from the medications.  Medications:  started Remeron 7.5 mg daily at bedtime  12/26:  On assessment patient reports "I feel very good". He reports feeling not depressed and not anxious anymore. His sleep has improved with Mirtazapine. He denies feeling suicidal or homicidal. He denies auditory or visual hallucinations. He continues to express interest in substance-abuse treatment program to address his drinking and drug use (cannabis and cocaine). Denies any current symptoms of alcohol, withdrawal. Denies side effects from the medications.  Medications:  increased Remeron 7.5 mg at bedtime to 15 mg daily at bedtime   12/27:  Patient has met maximum benefit of hospitalization.  No suicidal/homicidal ideations, hallucinations, or substance abuse.  Discharge instructions provided with explanations, Rx sent to the pharmacy, follow up appointment in place, and crisis numbers  Musculoskeletal: Strength & Muscle Tone: within normal limits Gait & Station: normal Patient leans: N/A  Blood pressure 104/68, pulse (!) 56, temperature 98.1 F (36.7 C), temperature source Oral, resp. rate 18, height 5' 11"  (1.803 m), weight 59 kg, SpO2 100 %.Body mass index is 18.13 kg/m.  General Appearance: Casual  Eye Contact:  Good  Speech:  Normal Rate  Volume:  Normal  Mood:  Anxious  Affect:  Congruent  Thought Process:  Coherent and Descriptions of Associations: Intact  Orientation:  Full (Time, Place, and Person)  Thought Content:  WDL and Logical  Suicidal Thoughts:  No  Homicidal Thoughts:  No  Memory:  Immediate;   Good Recent;   Good Remote;   Good  Judgement:  Fair  Insight:  Fair  Psychomotor Activity:  Normal  Concentration:  Concentration: Good and Attention Span: Good  Recall:   Good  Fund of Knowledge:  Good  Language:  Good  Akathisia:  No  Handed:  Right  AIMS (if indicated):     Assets:  Housing Leisure Time Physical Health Resilience Social Support  ADL's:  Intact  Cognition:  WNL  Sleep:  Number of Hours: 8.15     Physical Exam: Physical Exam Vitals and nursing note reviewed.  Constitutional:      Appearance: Normal appearance.  HENT:     Head: Normocephalic.     Nose: Nose normal.  Pulmonary:     Effort: Pulmonary effort is normal.  Musculoskeletal:        General: Normal range of motion.     Cervical back: Normal range of motion.  Neurological:     General: No focal deficit present.     Mental Status: He is alert and oriented to person, place, and time.  Psychiatric:        Attention and Perception: Attention and perception normal.        Mood and Affect: Mood is anxious.  Speech: Speech normal.        Behavior: Behavior normal. Behavior is cooperative.        Thought Content: Thought content normal.        Cognition and Memory: Cognition and memory normal.        Judgment: Judgment normal.   Review of Systems  Psychiatric/Behavioral:  The patient is nervous/anxious.   All other systems reviewed and are negative. Blood pressure 104/68, pulse (!) 56, temperature 98.1 F (36.7 C), temperature source Oral, resp. rate 18, height 5' 11"  (1.803 m), weight 59 kg, SpO2 100 %. Body mass index is 18.13 kg/m.   Social History   Tobacco Use  Smoking Status Every Day   Packs/day: 1.00   Years: 5.00   Pack years: 5.00   Types: Cigarettes  Smokeless Tobacco Never   Tobacco Cessation:  A prescription for an FDA-approved tobacco cessation medication provided at discharge   Blood Alcohol level:  Lab Results  Component Value Date   ETH 269 (H) 02/18/2021   ETH <10 44/96/7591    Metabolic Disorder Labs:  Lab Results  Component Value Date   HGBA1C 4.9 03/13/2018   MPG 93.93 03/13/2018   Lab Results  Component Value Date    PROLACTIN 5.5 07/11/2016   PROLACTIN 31.8 (H) 03/26/2016   Lab Results  Component Value Date   CHOL 180 03/13/2018   TRIG 122 03/13/2018   HDL 109 03/13/2018   CHOLHDL 1.7 03/13/2018   VLDL 24 03/13/2018   LDLCALC 47 03/13/2018   LDLCALC 70 07/11/2016    See Psychiatric Specialty Exam and Suicide Risk Assessment completed by Attending Physician prior to discharge.  Discharge destination:  Other:  rehab  Is patient on multiple antipsychotic therapies at discharge:  No   Has Patient had three or more failed trials of antipsychotic monotherapy by history:  No  Recommended Plan for Multiple Antipsychotic Therapies: NA  Discharge Instructions     Diet - low sodium heart healthy   Complete by: As directed    Discharge instructions   Complete by: As directed    Follow up with [facility]   Increase activity slowly   Complete by: As directed       Allergies as of 12/11/2021   No Known Allergies      Medication List     STOP taking these medications    ARIPiprazole 30 MG tablet Commonly known as: ABILIFY   ARIPiprazole ER 400 MG Susr       TAKE these medications      Indication  benztropine 1 MG tablet Commonly known as: COGENTIN Take 1 tablet (1 mg total) by mouth daily. Start taking on: December 12, 2021  Indication: Extrapyramidal Reaction caused by Medications   haloperidol 5 MG tablet Commonly known as: HALDOL Take 1 tablet (5 mg total) by mouth 2 (two) times daily.  Indication: Psychosis   mirtazapine 15 MG tablet Commonly known as: REMERON Take 1 tablet (15 mg total) by mouth at bedtime.  Indication: insomnia   nicotine 21 mg/24hr patch Commonly known as: NICODERM CQ - dosed in mg/24 hours Place 1 patch (21 mg total) onto the skin daily. Start taking on: December 12, 2021  Indication: Nicotine Addiction         Follow-up recommendations:  Activity:  as tolerated Diet:  heart healthy diet Schizoaffective disorder, bipolar  type: -Continue Haldol 5 mg BID   EPS: -Continue Cogentin 1 mg daily  Insomnia: -Continue Remeron 15 mg daily at  bedtime  Comments:  discharge to [facility]  Signed: Waylan Boga, NP 12/11/2021, 1:44 PM

## 2021-12-11 NOTE — Progress Notes (Signed)
[facility] LCSW Progress Note   12/11/2021    1:37 PM  Joshua [name]   [id number]   Type of Contact and Topic:  Discharge Planning   CSW met with patient at provider request regarding [facility] residential treatment. Patient states he was accepted to program in [place].   CSW confirmed with [facility] residential program that the patient has been accepted; requested H&P and MAR for patient. CSW team sent requested documents with written consent from patient.   Signed:  Durenda Hurt, MSW, LCSWA, LCAS 12/11/2021 1:37 PM

## 2021-12-11 NOTE — Progress Notes (Signed)
Recreation Therapy Notes   Date: 12/11/2021  Time: 10:20 am   Location: Craft room    Behavioral response: Appropriate  Intervention Topic: Goals   Discussion/Intervention:  Group content on today was focused on goals. Patients described what goals are and how they define goals. Individuals expressed how they go about setting goals and reaching them. The group identified how important goals are and if they make short term goals to reach long term goals. Patients described how many goals they work on at a time and what affects them not reaching their goal. Individuals described how much time they put into planning and obtaining their goals. The group participated in the intervention My Goal Board and made personal goal boards to help them achieve their goal. Clinical Observations/Feedback: Patient came to group and defined goals as something  to achieve or work towards. He stated that his goal is to finish working on his video game. Participant expressed that goals are important to spend time the right way. Individual engaged in the intervention and was social with staff and peers.  Charlotte Fidalgo LRT/CTRS           Jwan Hornbaker 12/11/2021 12:18 PM

## 2021-12-11 NOTE — [facility] Suicide Risk Assessment (Cosign Needed)
[facility] Admission Suicide Risk Assessment   Nursing information obtained from:  Patient Demographic factors:  Male Current Mental Status:  single Loss Factors:  none Historical Factors:  substance abuse Risk Reduction Factors:  supportive family, transitioning to recovery in [place] for substance abuse, reports being excited  Total Time spent with patient: 45 minutes Principal Problem: Schizoaffective disorder, bipolar type ([facility]) Diagnosis:  Principal Problem:   Schizoaffective disorder, bipolar type ([facility])  Subjective Data: "I'm excited, it's supposed to be a real nice place."  Denies depression, mild anxiety.  No suicidal/homicidal ideations, hallucinations, and withdrawal symptoms.  Continued Clinical Symptoms:  Alcohol Use Disorder Identification Test Final Score (AUDIT): 36 The "Alcohol Use Disorders Identification Test", Guidelines for Use in Primary Care, Second Edition.  World Science writer [facility]). Score between 0-7:  no or low risk or alcohol related problems. Score between 8-15:  moderate risk of alcohol related problems. Score between 16-19:  high risk of alcohol related problems. Score 20 or above:  warrants further diagnostic evaluation for alcohol dependence and treatment.   CLINICAL FACTORS:   Schizoaffective disorder, bipolar type   Musculoskeletal: Strength & Muscle Tone: within normal limits Gait & Station: normal Patient leans: N/A  Psychiatric Specialty Exam: Physical Exam Vitals and nursing note reviewed.  Constitutional:      Appearance: Normal appearance.  HENT:     Head: Normocephalic.     Nose: Nose normal.  Pulmonary:     Effort: Pulmonary effort is normal.  Musculoskeletal:        General: Normal range of motion.     Cervical back: Normal range of motion.  Neurological:     General: No focal deficit present.     Mental Status: He is alert and oriented to person, place, and time.  Psychiatric:        Attention and Perception: Attention  and perception normal.        Mood and Affect: Mood is anxious.        Speech: Speech normal.        Behavior: Behavior normal. Behavior is cooperative.        Thought Content: Thought content normal.        Cognition and Memory: Cognition and memory normal.        Judgment: Judgment normal.    Review of Systems  Psychiatric/Behavioral:  Positive for substance abuse. The patient is nervous/anxious.   All other systems reviewed and are negative.  Blood pressure 104/68, pulse (!) 56, temperature 98.1 F (36.7 C), temperature source Oral, resp. rate 18, height 5\' 11"  (1.803 m), weight 59 kg, SpO2 100 %.Body mass index is 18.13 kg/m.  General Appearance: Casual  Eye Contact:  Good  Speech:  Normal Rate  Volume:  Normal  Mood:  Anxious  Affect:  Congruent  Thought Process:  Coherent and Descriptions of Associations: Intact  Orientation:  Full (Time, Place, and Person)  Thought Content:  WDL and Logical  Suicidal Thoughts:  No  Homicidal Thoughts:  No  Memory:  Immediate;   Good Recent;   Good Remote;   Good  Judgement:  Fair  Insight:  Fair  Psychomotor Activity:  Normal  Concentration:  Concentration: Good and Attention Span: Good  Recall:  Good  Fund of Knowledge:  Good  Language:  Good  Akathisia:  No  Handed:  Right  AIMS (if indicated):     Assets:  Housing Leisure Time Physical Health Resilience Social Support  ADL's:  Intact  Cognition:  WNL  Sleep:  Number of Hours: 8.15    Physical Exam: Physical Exam Vitals and nursing note reviewed.  Constitutional:      Appearance: Normal appearance.  HENT:     Head: Normocephalic.     Nose: Nose normal.  Pulmonary:     Effort: Pulmonary effort is normal.  Musculoskeletal:        General: Normal range of motion.     Cervical back: Normal range of motion.  Neurological:     General: No focal deficit present.     Mental Status: He is alert and oriented to person, place, and time.  Psychiatric:        Attention and  Perception: Attention and perception normal.        Mood and Affect: Mood is anxious.        Speech: Speech normal.        Behavior: Behavior normal. Behavior is cooperative.        Thought Content: Thought content normal.        Cognition and Memory: Cognition and memory normal.        Judgment: Judgment normal.   Review of Systems  Psychiatric/Behavioral:  Positive for substance abuse. The patient is nervous/anxious.   All other systems reviewed and are negative. Blood pressure 104/68, pulse (!) 56, temperature 98.1 F (36.7 C), temperature source Oral, resp. rate 18, height 5\' 11"  (1.803 m), weight 59 kg, SpO2 100 %. Body mass index is 18.13 kg/m.   COGNITIVE FEATURES THAT CONTRIBUTE TO RISK:  None    SUICIDE RISK:   Minimal: No identifiable suicidal ideation.  Patients presenting with no risk factors but with morbid ruminations; may be classified as minimal risk based on the severity of the depressive symptoms  PLAN OF CARE:  Schizoaffective disorder, bipolar type: -Continue Haldol 5 mg BID   EPS: -Continue Cogentin 1 mg daily  Insomnia: -Continue Remeron 15 mg daily at bedtime  I certify that inpatient services furnished can reasonably be expected to improve the patient's condition.   , NP 12/11/2021, 1:35 PM

## 2021-12-11 NOTE — Discharge Instructions (Signed)
Follow up with [facility]

## 2021-12-11 NOTE — Group Note (Signed)
[facility] LCSW Group Therapy Note   Group Date: 12/11/2021 Start Time: 1300 End Time: 1400  Type of Therapy/Topic:  Group Therapy:  Feelings about Diagnosis  Participation Level:  Active   Description of Group:    This group will allow patients to explore their thoughts and feelings about diagnoses they have received. Patients will be guided to explore their level of understanding and acceptance of these diagnoses. Facilitator will encourage patients to process their thoughts and feelings about the reactions of others to their diagnosis, and will guide patients in identifying ways to discuss their diagnosis with significant others in their lives. This group will be process-oriented, with patients participating in exploration of their own experiences as well as giving and receiving support and challenge from other group members.   Therapeutic Goals: 1. Patient will demonstrate understanding of diagnosis as evidence by identifying two or more symptoms of the disorder:  2. Patient will be able to express two feelings regarding the diagnosis 3. Patient will demonstrate ability to communicate their needs through discussion and/or role plays  Summary of Patient Progress: Patient was an active participant in group.  Patient shared that for him his support sytem has been a good one.  Patient cautioned other group members to be aware of if their support systems are strong ones or ones that are not beneficial and doe more harm than good.    Therapeutic Modalities:   Cognitive Behavioral Therapy Brief Therapy Feelings Identification    Harden Mo, LCSW

## 2021-12-11 NOTE — Plan of Care (Signed)
  Problem: Education: Goal: Knowledge of [facility] Education information/materials will improve Outcome: Progressing Goal: Emotional status will improve Outcome: Progressing Goal: Mental status will improve Outcome: Progressing Goal: Verbalization of understanding the information provided will improve Outcome: Progressing   Problem: Activity: Goal: Interest or engagement in activities will improve Outcome: Progressing Goal: Sleeping patterns will improve Outcome: Progressing   Problem: Coping: Goal: Ability to verbalize frustrations and anger appropriately will improve Outcome: Progressing Goal: Ability to demonstrate self-control will improve Outcome: Progressing   

## 2021-12-11 NOTE — Progress Notes (Signed)
D: Pt alert and oriented. Pt rates depression 0/10, hopelessness 0/10, and anxiety 0/10. Pt goal: "Being discharged." Pt reports energy level as normal and concentration as being good. Pt reports sleep last night as being good. Pt did not receive medications for sleep. Pt denies experiencing any pain at this time. Pt denies experiencing any SI/HI, or AVH at this time.   A: Scheduled medications administered to pt, per MD orders. Support and encouragement provided. Frequent verbal contact made. Routine safety checks conducted q15 minutes.   R: No adverse drug reactions noted. Pt verbally contracts for safety at this time. Pt complaint with medications and treatment plan. Pt interacts well with others on the unit. Pt remains safe at this time. Will continue to monitor.

## 2021-12-11 NOTE — Progress Notes (Signed)
Patient has been isolative to his room. He is pleasant and cooperative and will engage in conversation when approached.  He is med compliant and received his meds without incident. He denies si  hi avh  depression anxiety and pain at this encounter.  Will continue to monitor with q15 minute safety checks.     Rosalie Doctor, LPN

## 2021-12-11 NOTE — Progress Notes (Signed)
°  [facility] Adult Case Management Discharge Plan :  Will you be returning to the same living situation after discharge:  No. CSW confirmed the patient was accepted to [facility] in [place].  At discharge, do you have transportation home?: Yes,  Patient's mother to assist with transportation to [facility] residential facility.   Do you have the ability to pay for your medications: No. No insurance noted. Provider to request 7 day supply from pharmacy.   Release of information consent forms completed and in the chart;  Patient's signature needed at discharge.  Patient to Follow up at:  Follow-up Information     [facility]. Go to.   Why: Mental health and primary care is coordinated by [facility] residential program. Please discuss medical care with staff when you arrive at the facility. Contact information: [phone]                Next level of care provider has access to [facility] Link:no  Safety Planning and Suicide Prevention discussed: Yes,  SPE completed with patient and Valeda Malm, Mother.    Has patient been referred to the Quitline?: Yes, faxed on 12/11/2021   Patient has been referred for addiction treatment: Yes  Corky Crafts, LCSWA 12/11/2021, 1:49 PM

## 2021-12-11 NOTE — Progress Notes (Signed)
D: Pt alert and oriented. Pt denies experiencing any pain, SI/HI, or AVH at this time. Pt reports he will be able to keep himself safe when he returns home.   A: Pt received discharge and medication education/information. Pt belongings were returned and signed for at this time.   R: Pt verbalized understanding of discharge and medication education/information.  Pt escorted to front lobby where pt's mother picked him up.

## 2022-08-12 NOTE — ED Triage Notes (Signed)
Formatting of this note might be different from the original.  Pt c/o left foot pain and nose bleeding also today  Electronically signed by Lucillie Garfinkel, RN at 08/12/2022 12:07 PM EDT

## 2022-08-12 NOTE — ED Provider Notes (Signed)
Formatting of this note is different from the original.      Final diagnoses:   None     HPI     Chief Complaint   Patient presents with   ? Foot Injury     Provider in Triage Note  Patient information was obtained from patient, caregiver / friend, and/or individuals accompanying patient.   History/Exam limitations: if any, see HPI.    HPI: : RIGHT FOOT INFECTION POSSIBLE     Physical Exam:  Triage Vital Signs Reviewed.   General:  Appears well, no distress  Additional PE Information:     MDM/Visit Events:     Please See Labs, Imaging, Medication orders for details. Please see nursing orders and documentation for details.     Additional Notes:  This patient was screened and had a rapid focused assessment by the provider in triage.  Patient has been advised that this is a rapid assessment to initiate their medical screening exam rather than definitive emergency medical care.  Further evaluation and necessary treatment will be assumed in a main treatment area by another provider unless this provider determines the patient may be safely discharged or otherwise dispositioned from triage.        ED Course & MDM         Medical Decision Making                    SAFE-T Assessment         Patient History     No past medical history on file.    No past surgical history on file.    No family history on file.         Review of Systems     Review of Systems     Physical Exam     ED Triage Vitals [08/12/22 1214]   Temp Heart Rate Resp BP   36.9 C (98.4 F) 68 18 120/80     SpO2 Temp Source Heart Rate Source Patient Position   98 % Oral Monitor;Right Sitting     BP Location FiO2 (%)     Right arm --       Physical Exam        Please see nursing notes for additional information regarding past medical, family, and social history as well as medication and vital signs. Please see nursing documentation for medications given.      Lynnae Sandhoff, DO  08/12/22 1218    Electronically signed by Lynnae Sandhoff, DO at  08/12/2022 12:18 PM EDT

## 2022-08-12 NOTE — Unmapped (Signed)
Formatting of this note is different from the original.  [facility]?NOTIFICATION?08/12/2022 12:02?Troop, Logan?MRN: [id number]    [facility]'s patient encounter information:   EPP:?[id number]  Account [id number]  Billing Account [id number]    Criteria Met      3+ Hospitals in 90 Days    History of Alcohol Use Disorder (12 mo.)    Security and Safety  No Security Events were found.  ED Care Guidelines  There are currently no ED Care Guidelines for this patient. Please check your facility's medical records system.    Prescription Monitoring Program  000??- Narcotic Use Score  000??- Sedative Use Score  000??- Stimulant Use Score  000??- Overdose Risk Score  - All Scores range from 000-999 with 75% of the population scoring < 200 and on 1% scoring above 650  - The last digit of the narcotic, sedative, and stimulant score indicates the number of active prescriptions of that type  - Higher Use scores correlate with increased prescribers, pharmacies, mg equiv, and overlapping prescriptions  - Higher Overdose Risk Scores correlate with increased risk of unintentional overdose death   Concerning or unexpectedly high scores should prompt a review of the PMP record; this does not constitute checking PMP for prescribing purposes.    E.D. Visit Count (12 mo.)  Facility Visits   [facility] 2   [facility] 1   [facility] 1   Total 4   Note: Visits indicate total known visits.     Recent Emergency Department Visit Summary  Date Facility [facility] Type Diagnoses or Chief Complaint    Aug 12, 2022  [place]  Emergency      RIGHT FOOT INFECTION POSSIBLE     Aug 04, 2022  [facility]  [place]  Emergency     Jul 30, 2022  [place]  Emergency      Encounter for general adult medical examination without abnormal findings      Nicotine dependence, unspecified, uncomplicated     Feb 05, 6605  [facility]  [place]   Emergency      Unvaccinated for COVID-19      Blood alcohol level of 200-239 mg/100 ml      Contact with and (suspected) exposure to COVID-19      Depression, unspecified      Alcohol dependence with intoxication, unspecified      Nicotine dependence, cigarettes, uncomplicated       Recent Inpatient Visit Summary  No Recent Inpatient Visits were found.  Care Team  Provider Specialty Phone Fax Service Dates   MASTERS, Ballard Russell MD A, M.D. Family Medicine   Current      [facility]  This patient has registered at the [facility] Emergency Department   For more information visit: [email or web address]     PLEASE NOTE:     1.   Any care recommendations and other clinical information are provided as guidelines or for historical purposes only, and providers should exercise their own clinical judgment when providing care.    2.   You may only use this information for purposes of treatment, payment or health care operations activities, and subject to the limitations of applicable [facility] Policies.    3.   You should consult directly with the organization that provided a care guideline or other clinical history with any questions about additional information or accuracy or completeness of information  provided.    ? 2409 [facility] - [email or web address]     Electronically signed by Interface, Incoming Transcriptions at 08/12/2022 12:03 PM EDT

## 2022-08-12 NOTE — ED Provider Notes (Signed)
Formatting of this note is different from the original.      Final diagnoses:   None     HPI     Chief Complaint   Patient presents with   ? Foot Injury     HPI        ED Course & MDM         Medical Decision Making  Asked nursing staff to assist patient with some skin-foot hygiene, dressed the bacitracin.  We will trial a short course of Keflex by mouth although this is probably a bit "aggressive" but would like to cover patient because of his homelessness status; he has lymphangitic streaking, fevers, foul-smelling drainage from the site.   Discussed with out reach to also provided patient with some low cost housing and homelessness    Risk  OTC drugs.                    SAFE-T Assessment         Patient History     No past medical history on file.    No past surgical history on file.    No family history on file.         Review of Systems     Review of Systems   Constitutional: Negative for activity change, appetite change, chills, fever and unexpected weight change.   HENT: Negative for sore throat and trouble swallowing.    Eyes: Negative for visual disturbance.   Respiratory: Negative for cough, chest tightness and shortness of breath.    Cardiovascular: Negative for chest pain, palpitations and leg swelling.   Gastrointestinal: Negative for abdominal pain, nausea and vomiting.   Endocrine: Negative for polydipsia and polyuria.   Genitourinary: Negative for difficulty urinating and flank pain.   Musculoskeletal: Negative for arthralgias, back pain, joint swelling and neck pain.   Skin: Positive for wound. Negative for pallor and rash.   Neurological: Negative for dizziness, light-headedness and headaches.      Physical Exam     ED Triage Vitals [08/12/22 1214]   Temp Heart Rate Resp BP   36.9 C (98.4 F) 68 18 120/80     SpO2 Temp Source Heart Rate Source Patient Position   98 % Oral Monitor;Right Sitting     BP Location FiO2 (%)     Right arm --       Physical Exam  Vitals and nursing note reviewed.    Constitutional:       General: He is not in acute distress.     Appearance: Normal appearance. He is well-developed and normal weight. He is not ill-appearing, toxic-appearing or diaphoretic.   HENT:      Head: Normocephalic and atraumatic.   Eyes:      Conjunctiva/sclera: Conjunctivae normal.   Cardiovascular:      Rate and Rhythm: Normal rate and regular rhythm.      Heart sounds: No murmur heard.  Pulmonary:      Effort: Pulmonary effort is normal. No respiratory distress.      Breath sounds: Normal breath sounds.   Abdominal:      Palpations: Abdomen is soft.      Tenderness: There is no abdominal tenderness.   Musculoskeletal:         General: Tenderness present. No swelling.      Cervical back: Neck supple.      Comments: Both toes are mildly red surrounding nail without focal paronychia appreciated on exam. Might represent early cellulitis  but no lymphangiitis-streaking. Superficial blister to the R great toe without drainage.    Skin:     General: Skin is warm and dry.      Capillary Refill: Capillary refill takes less than 2 seconds.   Neurological:      Mental Status: He is alert.   Psychiatric:         Mood and Affect: Mood normal.         This chart has been entirely or partly created using commercial voice recognition software.  It has been reviewed, however there may be errors in syntax, grammar, and spelling which are inadvertent.   Available collaborating physician for this encounter is: M TROENDLE  Racheal Patches, ACNP-BC [phone]    Please see nursing notes for additional information regarding past medical, family, and social history as well as medication and vital signs. Please see nursing documentation for medications given.      Gweneth Fritter, NP  08/12/22 1527    Electronically signed by Gweneth Fritter, NP at 08/12/2022  3:27 PM EDT

## 2022-11-21 DIAGNOSIS — F10120 Alcohol abuse with intoxication, uncomplicated: Secondary | ICD-10-CM

## 2022-11-21 DIAGNOSIS — F10920 Alcohol use, unspecified with intoxication, uncomplicated: Secondary | ICD-10-CM

## 2022-11-21 NOTE — ED Triage Notes (Signed)
Dropped off by police. Pt intoxicated. Pt reports he want help with detox. Denies si/hi

## 2022-11-21 NOTE — Discharge Instructions (Signed)
Substance Abuse and Addiction Resources    Community Services Boards    [place] [address] [phone] [place] [address] Crisis: [phone] or [phone]    [facility] [address] [phone] [place] [address] Crisis: [phone]    [facility] [facility] [address] [phone] [place] [address] Crisis: [phone]    [address] [phone] [place] [address] Crisis: [phone]    [place] [facility] [address] [phone] [place] [address] Crisis: [phone]    [facility] (Multiple locations) [address], [place] [address] [phone] [address], [place] [address] Crisis: [phone] [address], [place] [address]    [facility] [address] [phone] [place] [address] Crisis: [phone]    [facility] [address] [phone]. [place] [address]    [place] [phone] [address] [place] [address], [place] [address]    Outpatient    [facility] [address] [phone] [place] [address]    [facility] [address] [phone] [place] [address] Fax: [phone]    [facility] [address] [phone] [place] [address]    [facility] [address] [phone] [place] [address]    [address] [phone] [place] [address]    [address] [phone] [place] [address]    [facility] [address] [phone] [place] [address]    [facility] [address] [phone] [place] [address]    [facility] [address] [phone] [place] [address]    Partial Hospitalization    [facility] Partial Hospital[facility] [address] [phone] [place] [address]    Residential Treatment    [facility] at [phone]  Locations [place] or [phone]    [facility] [address] [phone] [place] [address]    [facility] Locations [place] [phone]    [facility] Locations vary [phone]    [facility] [address] [phone] [place] [address]    [facility] [address] [phone] [place] [address]    [facility] [address] [phone] [place] [address]    [facility] [place] [address] [phone] [place] [address]    [facility] Locations [place] [phone]    [facility] [phone] [address],   [place] [address]    [facility] [phone]    [facility] [address] [phone] [place] [address]    [facility] [phone]    [facility] [address] [phone] [place] [address] [phone]    [facility] [address] [phone] or [phone] [place] [address]    [facility] [phone] For Women [phone]    Interventions    [facility] [address] [phone] [place] [address]    Sober Living    [facility] [address] [phone]  [place] [address]    [facility] [phone] or [phone]

## 2022-11-21 NOTE — ED Provider Notes (Incomplete)
[facility]  EMERGENCY DEPARTMENT ENCOUNTER NOTE    Date: 11/21/2022  Patient Name: Preston Wood    History of Presenting Illness     Chief Complaint   Patient presents with    Alcohol Intoxication       History obtained from: Patient    HPI: Karmine Rhoderick, 31 y.o. male with past medical history as listed and reviewed below presents for alcohol intoxication.    Patient reports drinking beer PTP, last drink 1 hour before presentation. Presented with police due to intoxication. Denies drug use.    Besides intoxication the patient is able to answer questions and denies any abdominal pain, nausea, vomiting, chest pain, or shortness of breath. Ambulatory at baseline however has unsteady gait. No weakness or numbness in upper or lower extremities. No facial droop. No fevers, chills, cough, or sputum production. No trauma. No extremity pain. Denies any suicidal or homicidal ideations. No other complaints.      Medical History   I reviewed the medical, surgical, family, and social history, as well as allergies:    PCP: No primary care provider on file.    Past Medical History:  History reviewed. No pertinent past medical history.  Past Surgical History:  History reviewed. No pertinent surgical history.  Current Outpatient Medications:  No current outpatient medications   Family History:  History reviewed. No pertinent family history.  Social History:  Social History     Tobacco Use    Smoking status: Every Day     Types: Cigarettes    Smokeless tobacco: Never   Vaping Use    Vaping Use: Never used   Substance Use Topics    Alcohol use: Yes    Drug use: Yes     Types: Marijuana (Weed)     Allergies:  No Known Allergies    Review of Systems     Positives and pertinent negatives as per HPI, otherwise negative ROS.    Physical Exam and Vital Signs   Vital Signs - Reviewed the patient's vital signs.    Vitals:    11/21/22 2145 11/21/22 2146   BP: 125/77    Pulse: 58    Resp: 16     Temp: 98.2 F (36.8 C)    SpO2: 94%    Weight:  63.5 kg (140 lb)   Height:  1.854 m (6\' 1" )     Physical Exam:    GENERAL: awake, alert, cooperative, not in distress, intoxicated  HEENT:  * Pupils equal, EOMI  * Head atraumatic  CV:  * audible heart sounds  * warm and perfused extremities bilaterally  PULMONARY: Good air movement, no wheezes, no crackles  ABDOMEN/GU: soft, no distension, no guarding, no abdominal tenderness, murphy negative, Mcburney negative, Rovsig negative, no masses, no CVA tenderness.  EXTREMITIES/BACK: warm and perfused, no tenderness, no edema, no signs of DVT (warmth, asymmetric swelling, erythema, or calf tenderness). Patient moving all four extremities and has no tenderness over entirety of body on palpation.  SKIN: no rashes or signs of trauma  NEURO:  * Speech clear   * Moves U&LE to command without focal weakness  * No facial asymmetry.     Medical Decision Making     Patient is a 31 y.o. male presenting for alcohol intoxication. Vitals reveal no significant abnormalities and physical exam reveals alcohol intoxication without signs of trauma. Based on the history, physical exam, risk factors, and vital signs, I favor the following differential diagnoses:  alcohol intoxication. I doubt ICH or CVA as the patient has normal exam and no suggestive signs or significant altered mental status. No concern for ACS, CHF, pneumonia, PTX as the patient has no suggestive vital sign abnormalities or physical exam findings. Patient has an atraumatic exam therefore fractures or trauma are unlikely.    Will observe in the ED till clinical sobriety. See ED Course for evaluation and discussion.    Records Reviewed: Nursing Notes and Old Medical Records  Social Determinants of health affecting management: None    ED Course and Reassessment     ED Course:          Reassessment:    Patient is at baseline mentation, awake, alert, oriented, and ambulatory at time of discharge with a normal baseline gait. The  patient is able to carry a normal conversation; there is no evidence of delusions, hallucinations, suicidal ideation, or homicidal ideation. The patient tolerated PO intake. At this time, I feel the patient is safe to be discharged.      Understanding was insured that at this time there is no evidence for a more malignant underlying process, but that early in the process of an illness, an emergency department workup can be falsely reassuring.     Routine discharge counseling was given including the fact that any worsening, changing or persistent symptoms should prompt an immediate call or follow up with their primary physician or the emergency department. The importance of appropriate follow up was also discussed. More extensive discharge instructions were given in the patient's discharge paperwork.    After completion of evaluation and discussion of results and diagnoses, all the questions were answered. If required, all follow up appointments and treatments were discussed and explained. Understanding was insured prior to discharge.    Diagnosis     Clinical Impression:   1. Acute alcoholic intoxication without complication [facility])        Final Disposition     DISCHARGED FROM EMERGENCY DEPARTMENT    Patient will be discharged from the Emergency Department in stable condition. All of the diagnostic tests were reviewed and any questions were answered. Diagnosis, results, follow up if applicable, and return precautions were discussed. I have also put together printed discharge instructions for them that include: 1) educational information regarding their diagnosis, 2) how to care for their diagnosis at home, as well a 3) list of reasons why they would want to return to the ED prior to their follow-up appointment, should their condition change. Any labs or imaging done in the ED will be either printed with the discharge paperwork or available through MyChart.    DISCHARGE PLAN:  1. There are no discharge medications for  this patient.     2. No follow-up provider specified.  3.  Return to ED if worse    4.      Medication List      You have not been prescribed any medications.         Procedures, Critical Care, & Clinical Tools   Performed by: Tressie Ellis, MD  Procedures     Not Applicable     Results, Consults, Medications     Consults:  None   Labs:  No results found for this or any previous visit (from the past 12 hour(s)).  Radiologic Studies:  No orders to display     Medications ordered:  Medications - No data to display    Documentation Comments   - I am the first and  primary provider for this patient and am the primary provider of record.  - Initial assessment performed. The patients presenting problems have been discussed, and the staff are in agreement with the care plan formulated and outlined with them.  I have encouraged them to ask questions as they arise throughout their visit.  - Available medical records, nursing notes, old EKGs, and EMS run sheets (if patient was EMS transported) were reviewed    Please note that this dictation was completed with Dragon, the computer voice recognition software.  Quite often unanticipated grammatical, syntax, homophones, and other interpretive errors are inadvertently transcribed by the computer software.  Please disregard these errors.  Please excuse any errors that have escaped final proofreading.

## 2022-11-22 ENCOUNTER — Inpatient Hospital Stay
Admit: 2022-11-22 | Discharge: 2022-11-22 | Disposition: A | Discharge status: Home / Self Care | Payer: MEDICAID | Attending: Emergency Medicine

## 2022-11-22 MED ORDER — ONDANSETRON HCL 4 MG PO TABS
4 MG | ORAL_TABLET | Freq: Three times a day (TID) | ORAL | 0 refills | Status: AC | PRN
Start: 2022-11-22 — End: ?

## 2022-11-22 MED ORDER — FAMOTIDINE 20 MG PO TABS
20 MG | ORAL_TABLET | Freq: Two times a day (BID) | ORAL | 0 refills | Status: AC
Start: 2022-11-22 — End: 2022-11-28

## 2023-05-05 ENCOUNTER — Other Ambulatory Visit: Payer: Self-pay
